# Patient Record
Sex: Female | Born: 1965 | Race: White | Hispanic: No | Marital: Married | Smoking: Former smoker
Health system: Southern US, Community
[De-identification: ages and names within clinical notes are randomized; demographics above are authoritative.]

## PROBLEM LIST (undated history)

## (undated) DIAGNOSIS — N3010 Interstitial cystitis (chronic) without hematuria: Secondary | ICD-10-CM

## (undated) DIAGNOSIS — S129XXA Fracture of neck, unspecified, initial encounter: Secondary | ICD-10-CM

## (undated) DIAGNOSIS — K2270 Barrett's esophagus without dysplasia: Secondary | ICD-10-CM

## (undated) DIAGNOSIS — Z973 Presence of spectacles and contact lenses: Secondary | ICD-10-CM

## (undated) DIAGNOSIS — K219 Gastro-esophageal reflux disease without esophagitis: Secondary | ICD-10-CM

## (undated) DIAGNOSIS — M549 Dorsalgia, unspecified: Secondary | ICD-10-CM

## (undated) DIAGNOSIS — R3982 Chronic bladder pain: Secondary | ICD-10-CM

## (undated) DIAGNOSIS — F329 Major depressive disorder, single episode, unspecified: Secondary | ICD-10-CM

## (undated) DIAGNOSIS — Z8619 Personal history of other infectious and parasitic diseases: Secondary | ICD-10-CM

## (undated) DIAGNOSIS — F32A Depression, unspecified: Secondary | ICD-10-CM

## (undated) DIAGNOSIS — Z87442 Personal history of urinary calculi: Secondary | ICD-10-CM

## (undated) DIAGNOSIS — F419 Anxiety disorder, unspecified: Secondary | ICD-10-CM

## (undated) DIAGNOSIS — G4700 Insomnia, unspecified: Secondary | ICD-10-CM

## (undated) DIAGNOSIS — R3989 Other symptoms and signs involving the genitourinary system: Secondary | ICD-10-CM

## (undated) DIAGNOSIS — M961 Postlaminectomy syndrome, not elsewhere classified: Secondary | ICD-10-CM

## (undated) DIAGNOSIS — M1990 Unspecified osteoarthritis, unspecified site: Secondary | ICD-10-CM

## (undated) DIAGNOSIS — G8929 Other chronic pain: Secondary | ICD-10-CM

## (undated) DIAGNOSIS — K2970 Gastritis, unspecified, without bleeding: Secondary | ICD-10-CM

## (undated) HISTORY — PX: ESOPHAGOGASTRODUODENOSCOPY: SHX1529

## (undated) HISTORY — PX: DILATION AND CURETTAGE OF UTERUS: SHX78

---

## 1998-06-14 ENCOUNTER — Other Ambulatory Visit: Admission: RE | Admit: 1998-06-14 | Discharge: 1998-06-14 | Payer: Self-pay | Admitting: Obstetrics and Gynecology

## 1998-08-12 ENCOUNTER — Ambulatory Visit (HOSPITAL_COMMUNITY): Admission: RE | Admit: 1998-08-12 | Discharge: 1998-08-12 | Payer: Self-pay | Admitting: *Deleted

## 1998-12-09 ENCOUNTER — Encounter (INDEPENDENT_AMBULATORY_CARE_PROVIDER_SITE_OTHER): Payer: Self-pay | Admitting: Specialist

## 1998-12-09 ENCOUNTER — Ambulatory Visit (HOSPITAL_COMMUNITY): Admission: RE | Admit: 1998-12-09 | Discharge: 1998-12-09 | Payer: Self-pay | Admitting: Obstetrics and Gynecology

## 1999-01-20 ENCOUNTER — Inpatient Hospital Stay (HOSPITAL_COMMUNITY): Admission: AD | Admit: 1999-01-20 | Discharge: 1999-01-22 | Payer: Self-pay | Admitting: Obstetrics and Gynecology

## 1999-01-20 ENCOUNTER — Encounter (INDEPENDENT_AMBULATORY_CARE_PROVIDER_SITE_OTHER): Payer: Self-pay

## 1999-02-15 ENCOUNTER — Encounter: Admission: RE | Admit: 1999-02-15 | Discharge: 1999-05-16 | Payer: Self-pay | Admitting: Obstetrics and Gynecology

## 1999-07-07 ENCOUNTER — Other Ambulatory Visit: Admission: RE | Admit: 1999-07-07 | Discharge: 1999-07-07 | Payer: Self-pay | Admitting: Obstetrics and Gynecology

## 1999-11-08 ENCOUNTER — Encounter: Payer: Self-pay | Admitting: Internal Medicine

## 1999-11-08 ENCOUNTER — Emergency Department (HOSPITAL_COMMUNITY): Admission: RE | Admit: 1999-11-08 | Discharge: 1999-11-08 | Payer: Self-pay | Admitting: Internal Medicine

## 2000-07-22 ENCOUNTER — Other Ambulatory Visit: Admission: RE | Admit: 2000-07-22 | Discharge: 2000-07-22 | Payer: Self-pay | Admitting: Obstetrics and Gynecology

## 2001-05-12 ENCOUNTER — Encounter: Payer: Self-pay | Admitting: Obstetrics and Gynecology

## 2001-05-12 ENCOUNTER — Ambulatory Visit (HOSPITAL_COMMUNITY): Admission: RE | Admit: 2001-05-12 | Discharge: 2001-05-12 | Payer: Self-pay | Admitting: Obstetrics and Gynecology

## 2001-08-08 ENCOUNTER — Encounter: Payer: Self-pay | Admitting: Obstetrics and Gynecology

## 2001-08-08 ENCOUNTER — Ambulatory Visit (HOSPITAL_COMMUNITY): Admission: RE | Admit: 2001-08-08 | Discharge: 2001-08-08 | Payer: Self-pay | Admitting: Obstetrics and Gynecology

## 2001-08-22 ENCOUNTER — Encounter: Payer: Self-pay | Admitting: *Deleted

## 2001-08-22 ENCOUNTER — Ambulatory Visit (HOSPITAL_COMMUNITY): Admission: RE | Admit: 2001-08-22 | Discharge: 2001-08-22 | Payer: Self-pay | Admitting: *Deleted

## 2001-09-05 ENCOUNTER — Encounter: Payer: Self-pay | Admitting: *Deleted

## 2001-09-05 ENCOUNTER — Ambulatory Visit (HOSPITAL_COMMUNITY): Admission: RE | Admit: 2001-09-05 | Discharge: 2001-09-05 | Payer: Self-pay | Admitting: *Deleted

## 2001-09-19 ENCOUNTER — Encounter (HOSPITAL_COMMUNITY): Admission: RE | Admit: 2001-09-19 | Discharge: 2001-10-03 | Payer: Self-pay | Admitting: Internal Medicine

## 2001-10-06 ENCOUNTER — Inpatient Hospital Stay (HOSPITAL_COMMUNITY): Admission: AD | Admit: 2001-10-06 | Discharge: 2001-10-08 | Payer: Self-pay | Admitting: Obstetrics and Gynecology

## 2001-11-17 ENCOUNTER — Other Ambulatory Visit: Admission: RE | Admit: 2001-11-17 | Discharge: 2001-11-17 | Payer: Self-pay | Admitting: Obstetrics and Gynecology

## 2003-02-24 ENCOUNTER — Other Ambulatory Visit: Admission: RE | Admit: 2003-02-24 | Discharge: 2003-02-24 | Payer: Self-pay | Admitting: Obstetrics and Gynecology

## 2004-03-07 ENCOUNTER — Other Ambulatory Visit: Admission: RE | Admit: 2004-03-07 | Discharge: 2004-03-07 | Payer: Self-pay | Admitting: Obstetrics and Gynecology

## 2004-12-18 ENCOUNTER — Ambulatory Visit: Payer: Self-pay | Admitting: Internal Medicine

## 2005-03-16 ENCOUNTER — Other Ambulatory Visit: Admission: RE | Admit: 2005-03-16 | Discharge: 2005-03-16 | Payer: Self-pay | Admitting: Obstetrics and Gynecology

## 2005-07-26 ENCOUNTER — Ambulatory Visit: Payer: Self-pay | Admitting: Internal Medicine

## 2006-01-16 ENCOUNTER — Ambulatory Visit: Payer: Self-pay | Admitting: Internal Medicine

## 2006-01-16 LAB — CONVERTED CEMR LAB
Albumin: 4.2 g/dL (ref 3.5–5.2)
Alkaline Phosphatase: 55 units/L (ref 39–117)
BUN: 7 mg/dL (ref 6–23)
Eosinophil percent: 0.6 % (ref 0.0–5.0)
Free T4: 0.6 ng/dL — ABNORMAL LOW (ref 0.9–1.8)
HCT: 39.3 % (ref 36.0–46.0)
Hemoglobin: 13.6 g/dL (ref 12.0–15.0)
Lymphocytes Relative: 18.3 % (ref 12.0–46.0)
MCV: 94 fL (ref 78.0–100.0)
Monocytes Absolute: 0.2 10*3/uL (ref 0.2–0.7)
Neutrophils Relative %: 78 % — ABNORMAL HIGH (ref 43.0–77.0)
Platelets: 216 10*3/uL (ref 150–400)
Potassium: 3.4 meq/L — ABNORMAL LOW (ref 3.5–5.1)
Sodium: 141 meq/L (ref 135–145)
Total Protein: 6.7 g/dL (ref 6.0–8.3)
WBC: 6.2 10*3/uL (ref 4.5–10.5)

## 2006-01-18 ENCOUNTER — Encounter: Admission: RE | Admit: 2006-01-18 | Discharge: 2006-01-18 | Payer: Self-pay | Admitting: Internal Medicine

## 2006-02-06 ENCOUNTER — Ambulatory Visit: Payer: Self-pay | Admitting: Internal Medicine

## 2006-07-05 ENCOUNTER — Ambulatory Visit: Payer: Self-pay | Admitting: Internal Medicine

## 2006-10-18 ENCOUNTER — Encounter: Payer: Self-pay | Admitting: Internal Medicine

## 2007-03-12 ENCOUNTER — Ambulatory Visit: Payer: Self-pay | Admitting: Internal Medicine

## 2007-04-11 ENCOUNTER — Ambulatory Visit: Payer: Self-pay | Admitting: Internal Medicine

## 2007-06-09 ENCOUNTER — Ambulatory Visit: Payer: Self-pay | Admitting: Internal Medicine

## 2008-02-04 ENCOUNTER — Ambulatory Visit: Payer: Self-pay | Admitting: Internal Medicine

## 2008-02-05 ENCOUNTER — Encounter (INDEPENDENT_AMBULATORY_CARE_PROVIDER_SITE_OTHER): Payer: Self-pay | Admitting: *Deleted

## 2008-02-05 LAB — CONVERTED CEMR LAB
Basophils Absolute: 0 10*3/uL (ref 0.0–0.1)
Eosinophils Absolute: 0.1 10*3/uL (ref 0.0–0.7)
HCT: 39 % (ref 36.0–46.0)
Hemoglobin: 13.8 g/dL (ref 12.0–15.0)
Lymphocytes Relative: 17.7 % (ref 12.0–46.0)
MCV: 94.7 fL (ref 78.0–100.0)
Platelets: 239 10*3/uL (ref 150–400)
RDW: 12.4 % (ref 11.5–14.6)
WBC: 7.5 10*3/uL (ref 4.5–10.5)

## 2008-04-02 HISTORY — PX: BREAST ENHANCEMENT SURGERY: SHX7

## 2008-05-28 ENCOUNTER — Ambulatory Visit: Payer: Self-pay | Admitting: Internal Medicine

## 2008-08-27 ENCOUNTER — Ambulatory Visit: Payer: Self-pay | Admitting: Internal Medicine

## 2008-08-27 DIAGNOSIS — R51 Headache: Secondary | ICD-10-CM

## 2008-08-27 DIAGNOSIS — S139XXA Sprain of joints and ligaments of unspecified parts of neck, initial encounter: Secondary | ICD-10-CM | POA: Insufficient documentation

## 2008-08-27 DIAGNOSIS — R519 Headache, unspecified: Secondary | ICD-10-CM | POA: Insufficient documentation

## 2009-06-01 ENCOUNTER — Ambulatory Visit: Payer: Self-pay | Admitting: Internal Medicine

## 2009-06-01 DIAGNOSIS — F329 Major depressive disorder, single episode, unspecified: Secondary | ICD-10-CM

## 2009-10-07 ENCOUNTER — Ambulatory Visit: Payer: Self-pay | Admitting: Internal Medicine

## 2009-10-07 DIAGNOSIS — R634 Abnormal weight loss: Secondary | ICD-10-CM | POA: Insufficient documentation

## 2009-10-10 LAB — CONVERTED CEMR LAB
ALT: 18 units/L (ref 0–35)
Alkaline Phosphatase: 44 units/L (ref 39–117)
Basophils Relative: 0.4 % (ref 0.0–3.0)
Bilirubin, Direct: 0.2 mg/dL (ref 0.0–0.3)
Calcium: 8.9 mg/dL (ref 8.4–10.5)
Creatinine, Ser: 0.8 mg/dL (ref 0.4–1.2)
Glucose, Bld: 91 mg/dL (ref 70–99)
HCT: 39.3 % (ref 36.0–46.0)
Hemoglobin: 13.8 g/dL (ref 12.0–15.0)
Lymphocytes Relative: 19.7 % (ref 12.0–46.0)
Lymphs Abs: 1.1 10*3/uL (ref 0.7–4.0)
MCV: 96.2 fL (ref 78.0–100.0)
Neutrophils Relative %: 72.6 % (ref 43.0–77.0)
Platelets: 219 10*3/uL (ref 150.0–400.0)
RDW: 12.9 % (ref 11.5–14.6)
TSH: 0.68 microintl units/mL (ref 0.35–5.50)
Total Protein: 7.1 g/dL (ref 6.0–8.3)
WBC: 5.6 10*3/uL (ref 4.5–10.5)

## 2009-10-13 ENCOUNTER — Encounter: Payer: Self-pay | Admitting: Internal Medicine

## 2009-10-18 ENCOUNTER — Telehealth (INDEPENDENT_AMBULATORY_CARE_PROVIDER_SITE_OTHER): Payer: Self-pay | Admitting: *Deleted

## 2009-10-19 ENCOUNTER — Telehealth (INDEPENDENT_AMBULATORY_CARE_PROVIDER_SITE_OTHER): Payer: Self-pay | Admitting: *Deleted

## 2009-10-20 ENCOUNTER — Telehealth: Payer: Self-pay | Admitting: Gastroenterology

## 2009-10-21 ENCOUNTER — Ambulatory Visit: Payer: Self-pay | Admitting: Gastroenterology

## 2009-12-14 ENCOUNTER — Telehealth (INDEPENDENT_AMBULATORY_CARE_PROVIDER_SITE_OTHER): Payer: Self-pay | Admitting: *Deleted

## 2010-04-02 DIAGNOSIS — Z87442 Personal history of urinary calculi: Secondary | ICD-10-CM

## 2010-04-02 HISTORY — DX: Personal history of urinary calculi: Z87.442

## 2010-05-04 NOTE — Progress Notes (Signed)
Summary: Request for Culture Results  Phone Note Call from Patient Call back at [phone]   Summary of Call: Message left on triage VM: Patient would like stool culture results  I contacted patient and informed her as soon as report is addressed I will call her, patient ok'd Dr.Hopper please advise on reports to be discussed with patient./Chrae [facility] CMA  October 18, 2009 9:58 AM   Follow-up for Phone Call        all cultures & studies for ova &parasites are negative(please send reports).Continue Align  probiotic once daily until stools normal. GI consultation recommended if symptoms persist or progress Follow-up by: Marga Melnick MD,  October 19, 2009 8:04 AM  Additional Follow-up for Phone Call Additional follow up Details #1::        Left message on VM with results (copy of reports to be mailed). I informed patient to call if symptoms continue and we will refer to GI Additional Follow-up by: Shonna Chock CMA,  October 19, 2009 11:50 AM

## 2010-05-04 NOTE — Assessment & Plan Note (Signed)
Summary: dirrehea x's 3 weeks/cbs   Vital Signs:  Patient profile:   45 year old female Weight:      149 pounds Temp:     98.2 degrees F oral Pulse rate:   64 / minute Resp:     14 per minute BP sitting:   124 / 80  (left arm) Cuff size:   large  Vitals Entered By: Shonna Chock (October 07, 2009 12:19 PM) CC: Diarrhea since June 05/2009, Stomach pain, HA, and fatigue Comments REVIEWED MED LIST, PATIENT AGREED DOSE AND INSTRUCTION CORRECT    CC:  Diarrhea since June 05/2009, Stomach pain, HA, and and fatigue.  History of Present Illness:  Diarrhea      This is a 45 year old woman who presents with Diarrhea X 3 weeks.  The patient reports 6  or > stools per day, watery/unformed stools, voluminous stools,occasional mucus in stool, malodorous stools, nocturnal diarrhea, fasting diarrhea, bloating, gassiness, and abrupt onset of symptoms, but denies blood in stool, greasy stools, fecal urgency, fecal soiling, and alternating diarrhea/constipation.  Associated symptoms include lower , bilateral  abdominal pain, nausea, lightheadedness, and weight loss of 10- 12#.  The patient denies fever, vomiting, increased thirst, joint pains, mouth ulcers, and eye redness.  The symptoms are no  better with fasting  or Immodium. MGGF had colon cancer. PMH of diarrhea 15 years ago from excess magnesium from vitamin supplements inadvertantly.    Allergies: 1)  ! * Avelox 2)  ! Erythromycin  Review of Systems General:  Complains of chills; denies sweats. Eyes:  Denies discharge, eye pain, and red eye. ENT:  Denies difficulty swallowing and hoarseness. GU:  Denies discharge and hematuria.  Physical Exam  General:  Appears fatigued but in no acute distress; alert,appropriate and cooperative throughout examination Eyes:  No corneal or conjunctival inflammation noted. Perrla.No icterus. No lid lag Mouth:  Oral mucosa and oropharynx without lesions or exudates.  Teeth in good repair; tongue moist. Lungs:   Normal respiratory effort, chest expands symmetrically. Lungs are clear to auscultation, no crackles or wheezes. Heart:  regular rhythm, no murmur, no gallop, no rub, no JVD, no HJR, and bradycardia.   Abdomen:  Bowel sounds positive,abdomen soft  but slightly tender  over lower quadrants without masses, organomegaly or hernias noted. Extremities:  No clubbing, cyanosis, edema, or deformity noted .  Neurologic:  alert & oriented X3 and DTRs symmetrical and normal.  No tremor Skin:  Intact without suspicious lesions or rashes. No jaundice or tenting Cervical Nodes:  No lymphadenopathy noted Axillary Nodes:  No palpable lymphadenopathy Psych:  memory intact for recent and remote and normally interactive.     Impression & Recommendations:  Problem # 1:  DIARRHEA (ICD-787.91)  Orders: Venipuncture (16109) TLB-BMP (Basic Metabolic Panel-BMET) (80048-METABOL) TLB-CBC Platelet - w/Differential (85025-CBCD) TLB-Hepatic/Liver Function Pnl (80076-HEPATIC) TLB-TSH (Thyroid Stimulating Hormone) (84443-TSH) T-Culture, Stool (87045/87046-70140) T-Culture, C-Diff Toxin A/B (60454-09811) T-Stool Giardia / Crypto- EIA (91478) T-Stool for O&P (29562-13086)  Her updated medication list for this problem includes:    Lonox 2.5-0.025 Mg Tabs (Diphenoxylate-atropine) .Marland Kitchen... 1 as needed liquid stool  Problem # 2:  WEIGHT LOSS (ICD-783.21)  Orders: Venipuncture (57846) TLB-BMP (Basic Metabolic Panel-BMET) (80048-METABOL) TLB-CBC Platelet - w/Differential (85025-CBCD) TLB-Hepatic/Liver Function Pnl (80076-HEPATIC) TLB-TSH (Thyroid Stimulating Hormone) (84443-TSH) T-Culture, Stool (87045/87046-70140) T-Culture, C-Diff Toxin A/B ([id number]) T-Stool Giardia / Crypto- EIA (24401) T-Stool for O&P (02725-36644)  Complete Medication List: 1)  Lo Ovral  .... Take as directed 2)  Ambien 5 Mg  Tabs (Zolpidem tartrate) .... Take one nightly 3)  Xanax  .... Take 1/2 tablet as needed 4)  Cymbalta 60 Mg Cpep  (Duloxetine hcl) .Marland Kitchen.. 1 once daily 5)  Tylenol Extra Strength 500 Mg Tabs (Acetaminophen) .Marland Kitchen.. 1 by mouth once daily 6)  Lonox 2.5-0.025 Mg Tabs (Diphenoxylate-atropine) .Marland Kitchen.. 1 as needed liquid stool 7)  Metronidazole 500 Mg Tabs (Metronidazole) .Marland Kitchen.. 1 three times a day ; avoid alcohol 8)  Ciprofloxacin Hcl 500 Mg Tabs (Ciprofloxacin hcl) .Marland Kitchen.. 1 two times a day  Patient Instructions: 1)  Clear liquids & non dairy foods & drinks until stable. Prescriptions: CIPROFLOXACIN HCL 500 MG TABS (CIPROFLOXACIN HCL) 1 two times a day  #14 x 0   Entered and Authorized by:   Marga Melnick MD   Signed by:   Marga Melnick MD on 10/07/2009   Method used:   Print then Give to Patient   RxID:   [id number] METRONIDAZOLE 500 MG TABS (METRONIDAZOLE) 1 three times a day ; avoid alcohol  #21 x 0   Entered and Authorized by:   Marga Melnick MD   Signed by:   Marga Melnick MD on 10/07/2009   Method used:   Print then Give to Patient   RxID:   [id number] LONOX 2.5-0.025 MG TABS (DIPHENOXYLATE-ATROPINE) 1 as needed liquid stool  #12 x 0   Entered and Authorized by:   Marga Melnick MD   Signed by:   Marga Melnick MD on 10/07/2009   Method used:   Print then Give to Patient   RxID:   [id number]

## 2010-05-04 NOTE — Progress Notes (Signed)
Summary: nausea  Phone Note From Other Clinic   Caller: Marisue Ivan @ [facility] [employer] office [phone] Call For: dr.patterson Reason for Call: Schedule Patient Appt Summary of Call: Abd Pain, nause & diarrhea x6 days. Marisue Ivan scheduled her an appt for this miorning with Dr Jarold Motto but never got a hold of her to tell her about it. Patient doesnt feel she can wait until Sept for an appt. Initial call taken by: Leanor Kail [facility],  October 20, 2009 4:37 PM  Follow-up for Phone Call        Called Dr. Frederik Pear office,  Marisue Ivan not available.  Lupita Leash Surface RN  October 20, 2009 5:00 PM  Additional Follow-up for Phone Call Additional follow up Details #1::        I'll contact GI personally ; she was never notified by our office of appt. She is an incredibly nice & intelligent individual. Additional Follow-up by: Marga Melnick MD,  October 21, 2009 5:35 AM    Additional Follow-up for Phone Call Additional follow up Details #2::    Talked with pt.  Appt sch for pt to see Dr. Jarold Motto today at 10:00. Follow-up by: Ashok Cordia RN,  October 21, 2009 8:11 AM  +  ++

## 2010-05-04 NOTE — Progress Notes (Signed)
Summary: Patient still with symptoms-lmom  Phone Note Call from Patient Call back at Home Phone [phone]   Caller: Patient Summary of Call: Patient still with stomach pain, nausea, diarrhea at least 6 x daily, patient would like referral at this time. Patient was given ABX but told to hold off on filling until culture results in. Since cultures neg patient said she will not fill.  Dr.Hopper I place referral for GI is there anything futher you would like for me to advise to patient? Shonna Chock CMA  October 19, 2009 1:05 PM   Follow-up for Phone Call        I'll refer her , but initiate the antibiotics pending that appt as the cultures can not eliminate possibility of bacterial process totally Follow-up by: Marga Melnick MD,  October 19, 2009 2:24 PM  Additional Follow-up for Phone Call Additional follow up Details #1::        left message on machine ................Marland KitchenDoristine Devoid CMA  October 19, 2009 2:27 PM     Additional Follow-up for Phone Call Additional follow up Details #2::    no show for gi appt...drp Follow-up by: Mardella Layman MD Clementeen Graham,  October 20, 2009 9:38 AM   Appended Document: Patient still with symptoms-lmom Onalee Hua, she is a wonderful person & would never "No Show". My office dropped the ball; I'm sorry.  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 Please have your staff make contact to prevent  another miscommunication.Thanks , [employer]

## 2010-05-04 NOTE — Assessment & Plan Note (Signed)
Summary: Abd pain, nausea/dfs   History of Present Illness Visit Type: Initial Visit Primary GI MD: Sheryn Bison MD FACP FAGA Primary Provider: Marga Melnick, MD Chief Complaint: Abdominal pain, constantly and nausea History of Present Illness:   45 year old Caucasian female Anne [name] of Dr. Marga Melnick. She has had 6-8 weeks of nausea, occasional crampy abdominal pain and associated watery nonbloody diarrhea with 14 pound weight loss over the last 2 months. She's had multiple stool exams which had been unremarkable and were reviewed today. She denies sick family members at home, foreign travel, antibiotic use, infectious disease exposure, but she does have cats at home. She has not had chronic recurrent GI issues and generally has a regular bowel movement every other day. She's had no systemic complaints such as fever, chills, skin rashes, joint pains, or oral stomatitis. She denies indigestion, heartburn, or any hepatobiliary complaints.  She has a history of chronic depression but denies psychosis. She is a previous smoker but is not smoking at this time and denies ethanol abuse. She also denies use of frequent NSAIDs. Screening labs have been unremarkable.   GI Review of Systems    Reports abdominal pain, bloating, loss of appetite, nausea, and  weight loss.     Location of  Abdominal pain: lower abdomen. Weight loss of 14 pounds over 2 months.   Denies acid reflux, belching, chest pain, dysphagia with liquids, dysphagia with solids, heartburn, vomiting, vomiting blood, and  weight gain.      Reports change in bowel habits, diarrhea, and  fecal incontinence.     Denies anal fissure, black tarry stools, constipation, diverticulosis, heme positive stool, hemorrhoids, irritable bowel syndrome, jaundice, light color stool, liver problems, rectal bleeding, and  rectal pain. Preventive Screening-Counseling & Management  Alcohol-Tobacco     Smoking Status: quit      Drug Use:  no.       Current Medications (verified): 1)  Lo Ovral .... Take As Directed 2)  Ambien 5 Mg  Tabs (Zolpidem Tartrate) .... Take One Nightly 3)  Xanax .... Take 1/2 Tablet As Needed 4)  Cymbalta 60 Mg Cpep (Duloxetine Hcl) .Marland Kitchen.. 1 Once Daily 5)  Tylenol Extra Strength 500 Mg Tabs (Acetaminophen) .Marland Kitchen.. 1 By Mouth Once Daily As Needed 6)  Lonox 2.5-0.025 Mg Tabs (Diphenoxylate-Atropine) .Marland Kitchen.. 1 As Needed Liquid Stool  Allergies (verified): 1)  ! * Avelox 2)  ! Erythromycin  Past History:  Past medical, surgical, family and social histories (including risk factors) reviewed for relevance to current acute and chronic problems.  Past Medical History: Reviewed history from 08/27/2008 and no changes required. abnormal pap thyroid asymmetry nerve irritation and spasm ,  cervical 2006 tinea corporis atypical bronchitis shingles onycholysis  Past Surgical History: G 3 P 40M 1; Surgery for Post Partum bleeding Breasts implants  Family History: Reviewed history from 06/01/2009 and no changes required. Mother ovarian cancer; MGM lung tumor; PGF ? alcoholism Family History of Colon Cancer:Great,great GF  Social History: Reviewed history from Anne/07/2007 and no changes required. 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  Alcohol Use - yes 1 Daily Caffeine Use 1 Illicit Drug Use - no Drug Use:  no  Review of Systems       The Anne [name] complains of allergy/sinus, anxiety-new, cough, depression-new, fatigue, headaches-new, night sweats, sleeping problems, sore throat, and thirst - excessive.  The Anne [name] denies anemia, arthritis/joint pain, back pain, blood in urine, breast changes/lumps, change in vision, confusion, coughing up blood, fainting, fever, hearing problems, heart  murmur, heart rhythm changes, itching, menstrual pain, muscle pains/cramps, nosebleeds, pregnancy symptoms, shortness of breath, skin rash, swelling of feet/legs, swollen lymph glands, thirst - excessive , urination -  excessive , urination changes/pain, urine leakage, vision changes, and voice change.    Vital Signs:  Anne [name] profile:   45 year old female Height:      68 inches Weight:      150.25 pounds BMI:     22.93 Pulse rate:   72 / minute Pulse rhythm:   regular BP sitting:   104 / 78  (left arm) Cuff size:   regular  Vitals Entered By: June McMurray CMA Duncan Dull) (October 21, 2009 10:05 AM)  Physical Exam  General:  Well developed, well nourished, no acute distress.healthy appearing.   Head:  Normocephalic and atraumatic. Eyes:  PERRLA, no icterus.exam deferred to Anne [name]'s ophthalmologist.   Neck:  Supple; no masses or thyromegaly. Lungs:  Clear throughout to auscultation. Heart:  Regular rate and rhythm; no murmurs, rubs,  or bruits. Abdomen:  Soft, nontender and nondistended. No masses, hepatosplenomegaly or hernias noted. Normal bowel sounds. Rectal:  Normal exam.soft stool which is guaiac negative. Msk:  Symmetrical with no gross deformities. Normal posture. Pulses:  Normal pulses noted. Extremities:  No clubbing, cyanosis, edema or deformities noted. Neurologic:  Alert and  oriented x4;  grossly normal neurologically. Cervical Nodes:  No significant cervical adenopathy. Psych:  Alert and cooperative. Normal mood and affect.   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 It is not unusual to fail to identify Giardia on one stool O&P exam. We have decided to take metronidazole 250 mg t.i.d. for 10 days, and she is to give [place] a progress report at that time. If she still symptomatic we will proceed with further evaluation including endoscopy and colonoscopy and other malabsorption parameters. She is on low fiber diet, and denies use of sorbitol fructose. Physical exam today shows no evidence of chronic disease. She is on antidepressant medications which she is taken for several years.  Family history is noncontributory.  Problem # 2:  WEIGHT LOSS (ICD-783.21) Assessment: Comment Only  Problem # 3:  DEPRESSIVE DISORDER (ICD-311) Assessment: Improved Continue all other medications per Dr. Alwyn Ren  Anne [name] Instructions: 1)  Please continue current medications.  2)  Please report back in 10 days and let us know how you are doing. 3)  Call sooner if you get worse. 4)  The medication list was reviewed and reconciled.  All changed / newly prescribed medications were explained.  A complete medication list was provided to the Anne [name] / caregiver. 5)  Copy sent to : Dr. Marga Melnick 6)  Please continue current medications.  7)  Advised to stick with a low residue diet  avoiding food that can irritate bowel (see handout).

## 2010-05-04 NOTE — Progress Notes (Signed)
Summary: Refill Request  Phone Note Refill Request Call back at [phone] Message from:  Pharmacy on December 14, 2009 12:04 PM  Refills Requested: Medication #1:  CYMBALTA 60 MG CPEP 1 once daily   Dosage confirmed as above?Dosage Confirmed   Supply Requested: 1 month   Last Refilled: 11/17/2009 [facility]  Next Appointment Scheduled: none Initial call taken by: Lavell Islam,  December 14, 2009 12:04 PM    Prescriptions: CYMBALTA 60 MG CPEP (DULOXETINE HCL) 1 once daily  #30 x 5   Entered by:   Shonna Chock CMA   Authorized by:   Marga Melnick MD   Signed by:   Shonna Chock CMA on 12/14/2009   Method used:   Electronically to        [facility]. [phone]* (retail)       [address]       [place], [place]  [address]       Ph: [phone]       Fax: [phone]   RxID:   [id number]

## 2010-05-04 NOTE — Assessment & Plan Note (Signed)
Summary: anixety/kdc   Vital Signs:  Patient profile:   45 year old female Weight:      158.4 pounds Pulse rate:   76 / minute Resp:     16 per minute BP sitting:   110 / 68  (left arm) Cuff size:   large  Vitals Entered By: Shonna Chock (June 01, 2009 4:12 PM) CC: Anxiety Comments REVIEWED MED LIST, PATIENT AGREED DOSE AND INSTRUCTION CORRECT    CC:  Anxiety.  History of Present Illness: Her mother has ovarian cancer with  diffuse metastases;  the illness appears terminal. Her son has learning disability & is having home behavioral issues. He has appt with a Haematologist.She feels overwhelmed.She is on Citalopram  20 mg two times a day .Pathophysiology of NT deficiency discussed  Allergies: 1)  ! * Avelox 2)  ! Erythromycin  Family History: Mother ovarian cancer; MGM lung tumor; PGF ? alcoholism  Review of Systems General:  Complains of sleep disorder; denies loss of appetite; Awakens during night ruminating . Psych:  Complains of anxiety, depression, and easily tearful; denies easily angered, irritability, and panic attacks; "I feel paralyzed @ times".  Physical Exam  General:  ; alert,appropriate and cooperative throughout examination; tearful Psych:  not anxious appearing, flat affect, and subdued.     Impression & Recommendations:  Problem # 1:  DEPRESSIVE DISORDER (ICD-311)  The following medications were removed from the medication list:    Citalopram Hydrobromide 20 Mg Tabs (Citalopram hydrobromide) .Marland Kitchen... 2 qd Her updated medication list for this problem includes:    Cymbalta 60 Mg Cpep (Duloxetine hcl) .Marland Kitchen... 1 once daily  Complete Medication List: 1)  Lo Ovral  .... Take as directed 2)  Ambien 5 Mg Tabs (Zolpidem tartrate) .... Take one nightly 3)  Xanax  .... Take 1/2 tablet as needed 4)  Cymbalta 60 Mg Cpep (Duloxetine hcl) .Marland Kitchen.. 1 once daily  Patient Instructions: 1)  Decrease Citalopram to 20 mg once daily X 2 days then start Cymbalta 30 mg once daily  X 5 . Then increase to 60 mg once daily  Prescriptions: CYMBALTA 60 MG CPEP (DULOXETINE HCL) 1 once daily  #30 x 5   Entered and Authorized by:   Marga Melnick MD   Signed by:   Marga Melnick MD on 06/01/2009   Method used:   Print then Give to Patient   RxID:   [id number]

## 2010-06-19 ENCOUNTER — Encounter: Payer: Self-pay | Admitting: Internal Medicine

## 2010-06-19 ENCOUNTER — Ambulatory Visit (INDEPENDENT_AMBULATORY_CARE_PROVIDER_SITE_OTHER): Payer: BC Managed Care – PPO | Admitting: Internal Medicine

## 2010-06-19 DIAGNOSIS — R51 Headache: Secondary | ICD-10-CM

## 2010-06-19 DIAGNOSIS — S139XXA Sprain of joints and ligaments of unspecified parts of neck, initial encounter: Secondary | ICD-10-CM

## 2010-06-29 NOTE — Assessment & Plan Note (Signed)
Summary: talk abt meds, having headaches, tight neck muscles///sph   Vital Signs:  Patient profile:   45 year old female Weight:      158.2 pounds BMI:     24.14 Temp:     98.4 degrees F oral Pulse rate:   84 / minute Resp:     14 per minute BP sitting:   130 / 82  (left arm) Cuff size:   large  Vitals Entered By: Shonna Chock CMA (June 19, 2010 2:49 PM) CC: 1.) Discuss meds: Cymbalta   2.) Discuss Headaches  3.) Injured tailbone in 12/2009 and still with complications from that    Primary Care Provider:  Marga Melnick, MD  CC:  1.) Discuss meds: Cymbalta   2.) Discuss Headaches  3.) Injured tailbone in 12/2009 and still with complications from that .  History of Present Illness:    Onset several months ago w/o trigger or injury ; ? posture & stress related. She  reports nausea and sweats, but denies vomiting, tearing of eyes, nasal congestion, sinus pressure, photophobia, and phonophobia.  The headache is described as intermittent and sharp.  The location of the pain is occipital.  High-risk features (red flags) include neck pain/stiffness and new type of headache.  The patient denies the following high-risk features: fever, vision loss or change, focal weakness, and pain worse with exertion.  Prior treatment has included a NSAID & Tylenol with partial relief.Deep massage helps for up to 48hrs. PMH of cervical spasm ; no PMH of migraines.   Current Medications (verified): 1)  Lo Ovral .... Take As Directed 2)  Ambien 5 Mg  Tabs (Zolpidem Tartrate) .... Take One Nightly 3)  Xanax .... Take 1/2 Tablet As Needed 4)  Cymbalta 60 Mg Cpep (Duloxetine Hcl) .Marland Kitchen.. 1 Once Daily 5)  Tylenol Extra Strength 500 Mg Tabs (Acetaminophen) .Marland Kitchen.. 1 By Mouth Once Daily As Needed 6)  Aleve 220 Mg Tabs (Naproxen Sodium) .... As Needed 7)  Motrin Ib 200 Mg Tabs (Ibuprofen) .... As Needed  Allergies: 1)  ! * Avelox 2)  ! Erythromycin  Past History:  Past Medical History: abnormal pap thyroid  asymmetry nerve irritation and spasm ,  cervical  spine 2006 tinea corporis atypical bronchitis shingles onycholysis  Review of Systems Eyes:  Denies blurring, double vision, and vision loss-both eyes. ENT:  Denies decreased hearing and ringing in ears. Neuro:  Complains of numbness and tingling; denies brief paralysis, disturbances in coordination, and poor balance; N&T in forearms & hands when working .  Physical Exam  General:  well-nourished,in no acute distress; alert,appropriate and cooperative throughout examination Eyes:  No corneal or conjunctival inflammation noted. EOMI. Perrla. Field of  Vision grossly normal. Ears:  External ear exam shows no significant lesions or deformities.  Otoscopic examination reveals clear canals, tympanic membranes are intact bilaterally without bulging, retraction, inflammation or discharge. Hearing is grossly normal bilaterally. Nose:  External nasal examination shows no deformity or inflammation. Nasal mucosa are pink and moist without lesions or exudates. Mouth:  Oral mucosa and oropharynx without lesions or exudates.  Teeth in good repair. No tongue deviation Neck:  No deformities, masses, or tenderness noted. Pain with ROM , especially with flexion & to L Heart:  Normal rate and regular rhythm. S1 and S2 normal without gallop, murmur, click, rub.S4 Pulses:  R and L carotid,radial  pulses are full and equal bilaterally Neurologic:  alert & oriented X3, cranial nerves II-XII intact, strength normal in all extremities, sensation intact  to light touch, gait normal, DTRs symmetrical and normal, finger-to-nose normal, and Romberg negative.   Skin:  Intact without suspicious lesions or rashes Cervical Nodes:  No lymphadenopathy noted Axillary Nodes:  No palpable lymphadenopathy Psych:  memory intact for recent and remote, normally interactive, and good eye contact.     Impression & Recommendations:  Problem # 1:  HEADACHE (ICD-784.0)  Her updated  medication list for this problem includes:    Tylenol Extra Strength 500 Mg Tabs (Acetaminophen) .Marland Kitchen... 1 by mouth once daily as needed    Aleve 220 Mg Tabs (Naproxen sodium) .Marland Kitchen... As needed    Motrin Ib 200 Mg Tabs (Ibuprofen) .Marland Kitchen... As needed    Meloxicam 7.5 Mg Tabs (Meloxicam) .Marland Kitchen... 1 two times a day as needed for pain  Orders: T-Cervical Spine Comp w/Flex & Ext (44010UV)  Problem # 2:  CERVICAL STRAIN, BILATERAL (ICD-847.0)  PMH of Her updated medication list for this problem includes:    Tylenol Extra Strength 500 Mg Tabs (Acetaminophen) .Marland Kitchen... 1 by mouth once daily as needed    Aleve 220 Mg Tabs (Naproxen sodium) .Marland Kitchen... As needed    Motrin Ib 200 Mg Tabs (Ibuprofen) .Marland Kitchen... As needed    Meloxicam 7.5 Mg Tabs (Meloxicam) .Marland Kitchen... 1 two times a day as needed for pain    Carisoprodol 350 Mg Tabs (Carisoprodol) .Marland Kitchen... 1 at bedtime as needed  Orders: T-Cervical Spine Comp w/Flex & Ext (25366YQ)  Complete Medication List: 1)  Lo Ovral  .... Take as directed 2)  Ambien 5 Mg Tabs (Zolpidem tartrate) .... Take one nightly 3)  Xanax  .... Take 1/2 tablet as needed 4)  Cymbalta 60 Mg Cpep (Duloxetine hcl) .Marland Kitchen.. 1 once daily 5)  Tylenol Extra Strength 500 Mg Tabs (Acetaminophen) .Marland Kitchen.. 1 by mouth once daily as needed 6)  Aleve 220 Mg Tabs (Naproxen sodium) .... As needed 7)  Motrin Ib 200 Mg Tabs (Ibuprofen) .... As needed 8)  Meloxicam 7.5 Mg Tabs (Meloxicam) .Marland Kitchen.. 1 two times a day as needed for pain 9)  Carisoprodol 350 Mg Tabs (Carisoprodol) .Marland Kitchen.. 1 at bedtime as needed  Patient Instructions: 1)  Massage Therapy is medicallly recommended. Ergonomics   as discussed. Consider Physical Therapy or Chiropractry. Prescriptions: CARISOPRODOL 350 MG TABS (CARISOPRODOL) 1 at bedtime as needed  #30 x 1   Entered and Authorized by:   Marga Melnick MD   Signed by:   Marga Melnick MD on 06/19/2010   Method used:   Electronically to        [facility]. [phone]* (retail)       [address]       [place], [place]  [address]       Ph: [phone]       Fax: [phone]   RxID:   [id number] MELOXICAM 7.5 MG TABS (MELOXICAM) 1 two times a day as needed for pain  #30 x 0   Entered and Authorized by:   Marga Melnick MD   Signed by:   Marga Melnick MD on 06/19/2010   Method used:   Electronically to        [facility]. [phone]* (retail)       [address]       [place], [place]  [address]       Ph: [phone]       Fax: [phone]   RxID:  [id number]    Orders Added: 1)  Est. Patient Level IV [14782] 2)  T-Cervical Spine Comp w/Flex & Ext [72052TC]

## 2010-08-18 NOTE — Discharge Summary (Signed)
NAME:  Anne [name], Anne [name]                       ACCOUNT NO.:  [id number]   MEDICAL RECORD NO.:  [id number]                   PATIENT TYPE:  REC   LOCATION:  [facility]                                 FACILITY:  [facility]   PHYSICIAN:  Huel Cote, NP                DATE OF BIRTH:  02/09/1966   DATE OF ADMISSION:  10/06/2001  DATE OF DISCHARGE:  10/08/2001                                 DISCHARGE SUMMARY   DISCHARGE DIAGNOSES:  1. Status post term delivery at 39+ weeks, delivered.  2. Status post normal spontaneous vaginal delivery.  3. Mild ventriculomegaly in the infant.  4. Advanced maternal age.   DISCHARGE MEDICATIONS:  1. Motrin 600 mg p.o. q.6h.  2. Percocet one to two tablets p.o. q.4h. p.r.n.   DISCHARGE FOLLOWUP:  The patient is to follow up in six weeks for her  routine postpartum examination.   HOSPITAL COURSE:  The patient is a 45 year old G3, P1-0-1-1 who is admitted  at 39 weeks for elective induction.  Prenatal care had been complicated by  advanced maternal age with a normal triple screen and a normal ultrasound.  She declined amniocentesis.  An ultrasound at 30 weeks for size less than  dates revealed a normal growth, but a dilated left cerebral ventricle which  was followed and remained stable and was going to be examined postnatally.   PRENATAL LABORATORIES:  O+.  Antibody negative.  RPR nonreactive.  Rubella  immune.  Hepatitis B surface antigen negative.  GC negative.  Chlamydia  negative.  Triple screen normal.  One hour Glucola 116.  Group B Strep  positive.   PAST OBSTETRICAL HISTORY:  In 1999 she had a miscarriage.  In 2000 she had a  forceps delivery of a [redacted] week gestation at 7 pounds 14 ounces with retained  placenta.   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   PAST MEDICAL HISTORY:  History of peptic ulcer disease and shingles.   PAST SURGICAL HISTORY:  None.   ALLERGIES:  IV CONTRAST and  ERYTHROMYCIN.   MEDICATIONS:  None.   SOCIAL HISTORY:  The patient is married and a Environmental education officer.   PHYSICAL EXAMINATION:  VITAL SIGNS:  She is afebrile with stable vital  signs.  Fetal heart rate is reactive.  ABDOMEN:  Gravid and nontender.  PELVIC:  On vaginal examination she was 3 cm, 40%, and -1 station.   She was placed on penicillin prophylaxis for her positive group B Strep  status and ruptured membranes were attempted with little fluid obtained.  The patient was then placed on IV Pitocin and very quickly throughout the  day reached complete dilation and pushed for approximately 20 minutes with a  normal spontaneous vaginal delivery of a vigorous female infant over a first  degree perineal laceration.  Apgars were 8/9.  Weight was 6 pounds 15  ounces.  There was  a nuchal cord x1 which was delivered.  Placenta delivered  spontaneously with some clots that were adherent to it.  First degree  laceration was repaired with 3-0 Vicryl for hemostasis.  Cervix and rectum  were intact.  Estimated blood loss was 350 cc.  The patient  was then  admitted for routine postpartum care.  The baby did have an ultrasound  postnatally which appeared normal and the patient remained stable throughout  her postpartum stay.  On postpartum day 2 she was afebrile with stable vital  signs and was felt stable for discharge home.  Therefore, was discharged  with medications and follow-up as previously stated.                                               Huel Cote, NP    KR/MEDQ  D:  11/06/2001  T:  11/08/2001  Job:  [id number]

## 2010-08-18 NOTE — Assessment & Plan Note (Signed)
[facility]                          [place] OFFICE NOTE   Anne [name], Anne [name]                    MRN:          [id number]  DATE:01/16/2006                            DOB:          May 01, 1965    Anne [name] was seen January 16, 2006 complaining abdominal bloating and  gas and bowel movement changes. Symptoms began approximately July 18 and  have been associated with lower abdominal pain, particularly in the right  lower quadrant. She has had nausea but no vomiting.   Significantly, her mother has ovarian cancer and has urged her to be  evaluated.   She averages 2 bowel movements a day which can be watery and are poorly  formed. The right lower quadrant discomfort is not associated with bowel  movements and is described as dull and persistent.   In June, she was treated with amoxicillin for 10 days for a sinus infection  but has not had persistent diarrhea.   Her menses are normal; she is on birth control pills.   She has had no travel except to the beach. She denies ingestion of raw  shellfish. She has city water source. She has no sick pets.   She states that her last Pap smear in December 2006 had some abnormality but  did not require follow up for one year.   She is on Nexium 40 mg daily, Ambien as needed, Lexapro, Xanax and Benadryl.   ERYTHROMYCIN CAUSES NAUSEA.   Weight was down slightly over 2 pounds to 145. She was afebrile and vital  signs were normal.  She had no icterus or jaundice.  The lower abdomen was slightly tender. She had no organomegaly or  lymphadenopathy.   Normal and negative studies included CBC and differential and CMET with the  exception of potassium of 3.4. Her free T3 was mildly reduced at 0.6, but  her TSH was normal. Pending are stool cards. Ultrasound on October 19 at  [facility] revealed a 3.7-mm echogenic rounded structure adjacent to  the anterior aspect of the endometrial  lining at the fundus-body junction.  The differential included a small polyp, echogenic fibroid or scar. She also  had bilateral ovarian follicles with no evidence of an ovarian mass.   A message was left on her cell phone with these reports. I do recommend  follow up with a gynecologist because of history of some changes in her Pap  smear and these ultrasound findings.   If that evaluation is negative, then I would recommend a GI evaluation. If  she develops frank diarrhea, then stool for ova and parasites and  clostridium difficile colitis, salmonella and shigella could be pursued.  These are not being completed at this time as she is afebrile and has a  normal white count and no increase in eosinophils. Levsin SL 0.125 mg  sublingually as needed was prescribed for pain.     Titus Dubin. Alwyn Ren, MD,FACP,FCCP    [facility]/MedQ  DD: 01/24/2006  DT: 01/25/2006  Job #: [id number]   cc:   Milus Mallick

## 2010-09-22 ENCOUNTER — Other Ambulatory Visit: Payer: Self-pay | Admitting: Internal Medicine

## 2010-10-24 ENCOUNTER — Other Ambulatory Visit: Payer: Self-pay | Admitting: Internal Medicine

## 2010-11-26 ENCOUNTER — Other Ambulatory Visit: Payer: Self-pay | Admitting: Internal Medicine

## 2010-12-30 ENCOUNTER — Other Ambulatory Visit: Payer: Self-pay | Admitting: Internal Medicine

## 2011-01-30 ENCOUNTER — Other Ambulatory Visit: Payer: Self-pay | Admitting: Internal Medicine

## 2011-01-31 NOTE — Telephone Encounter (Signed)
Done

## 2011-01-31 NOTE — Telephone Encounter (Signed)
OK X 1 OVINB 

## 2011-01-31 NOTE — Telephone Encounter (Signed)
Soma [last refill #30x0 12/30/10]

## 2011-04-02 ENCOUNTER — Other Ambulatory Visit: Payer: Self-pay | Admitting: Internal Medicine

## 2011-04-02 NOTE — Telephone Encounter (Signed)
#  30 okay. Labeled as "take as infrequently as possible, as needed only. May cause drowsiness and affect balance."

## 2011-04-02 NOTE — Telephone Encounter (Signed)
Last filled 01-30-11 #30 1, last ov 06-19-10

## 2011-04-06 ENCOUNTER — Other Ambulatory Visit: Payer: Self-pay | Admitting: Internal Medicine

## 2011-04-06 MED ORDER — CARISOPRODOL 350 MG PO TABS: ORAL_TABLET | ORAL | Status: DC

## 2011-04-06 NOTE — Telephone Encounter (Signed)
Called pharmacy, rx was sent in on 04/02/11 (never received), ok'd #30/0 refills

## 2011-04-17 ENCOUNTER — Other Ambulatory Visit: Payer: Self-pay | Admitting: Internal Medicine

## 2011-04-18 ENCOUNTER — Other Ambulatory Visit: Payer: Self-pay

## 2011-04-18 MED ORDER — CARISOPRODOL 350 MG PO TABS: ORAL_TABLET | ORAL | Status: DC

## 2011-04-18 NOTE — Telephone Encounter (Signed)
Has to print at [facility]. Printed RX shredded at OR

## 2011-04-20 ENCOUNTER — Ambulatory Visit (INDEPENDENT_AMBULATORY_CARE_PROVIDER_SITE_OTHER): Payer: BC Managed Care – PPO | Admitting: Family Medicine

## 2011-04-20 ENCOUNTER — Encounter: Payer: Self-pay | Admitting: Family Medicine

## 2011-04-20 VITALS — BP 120/70 | HR 109 | Temp 98.6°F | Ht 67.75 in | Wt 148.4 lb

## 2011-04-20 DIAGNOSIS — J3489 Other specified disorders of nose and nasal sinuses: Secondary | ICD-10-CM

## 2011-04-20 DIAGNOSIS — J329 Chronic sinusitis, unspecified: Secondary | ICD-10-CM | POA: Insufficient documentation

## 2011-04-20 MED ORDER — BENZONATATE 200 MG PO CAPS: 200.0000 mg | ORAL_CAPSULE | Freq: Three times a day (TID) | ORAL | Status: AC | PRN

## 2011-04-20 MED ORDER — GUAIFENESIN-CODEINE 100-10 MG/5ML PO SYRP: 10.0000 mL | ORAL_SOLUTION | Freq: Three times a day (TID) | ORAL | Status: AC | PRN

## 2011-04-20 MED ORDER — AMOXICILLIN 875 MG PO TABS: 875.0000 mg | ORAL_TABLET | Freq: Two times a day (BID) | ORAL | Status: AC

## 2011-04-20 NOTE — Patient Instructions (Signed)
This is a sinus infection Take the Amoxicillin as directed- take w/ food to avoid upset stomach Add Mucinex to thin your congestion Drink plenty of fluids REST! Cough meds as needed Call with any questions or concerns Hang in there!!!

## 2011-04-20 NOTE — Assessment & Plan Note (Signed)
New.  Pt's sxs and PE consistent w/ infxn.  Start abx.  Reviewed supportive care and red flags that should prompt return.  Pt expressed understanding and is in agreement w/ plan.  

## 2011-04-20 NOTE — Progress Notes (Signed)
  Subjective:    Patient ID: Anne [name], female    DOB: December 26, 1965, 46 y.o.   MRN: [id number]  HPI ? Sinusitis- sxs started Monday night after flying to [place].  Sister was sick.  + facial pain, tooth pain, HA, sore throat, cough- productive.  No fever.  Hx of similar.   Review of Systems For ROS see HPI     Objective:   Physical Exam  Vitals reviewed. Constitutional: She appears well-developed and well-nourished. No distress.  HENT:  Head: Normocephalic and atraumatic.  Right Ear: Tympanic membrane normal.  Left Ear: Tympanic membrane normal.  Nose: Mucosal edema and rhinorrhea present. Right sinus exhibits maxillary sinus tenderness and frontal sinus tenderness. Left sinus exhibits maxillary sinus tenderness and frontal sinus tenderness.  Mouth/Throat: Uvula is midline and mucous membranes are normal. Posterior oropharyngeal erythema present. No oropharyngeal exudate.  Eyes: Conjunctivae and EOM are normal. Pupils are equal, round, and reactive to light.  Neck: Normal range of motion. Neck supple.  Cardiovascular: Normal rate, regular rhythm and normal heart sounds.   Pulmonary/Chest: Effort normal and breath sounds normal. No respiratory distress. She has no wheezes.  Lymphadenopathy:    She has no cervical adenopathy.          Assessment & Plan:

## 2011-05-18 ENCOUNTER — Telehealth: Payer: Self-pay | Admitting: Internal Medicine

## 2011-05-18 ENCOUNTER — Ambulatory Visit (INDEPENDENT_AMBULATORY_CARE_PROVIDER_SITE_OTHER): Payer: BC Managed Care – PPO | Admitting: Family Medicine

## 2011-05-18 ENCOUNTER — Encounter: Payer: Self-pay | Admitting: Family Medicine

## 2011-05-18 VITALS — BP 125/80 | HR 87 | Temp 98.0°F | Ht 68.0 in | Wt 153.6 lb

## 2011-05-18 DIAGNOSIS — J329 Chronic sinusitis, unspecified: Secondary | ICD-10-CM

## 2011-05-18 MED ORDER — DULOXETINE HCL 60 MG PO CPEP: 60.0000 mg | ORAL_CAPSULE | Freq: Every day | ORAL | Status: DC

## 2011-05-18 MED ORDER — DOXYCYCLINE HYCLATE 100 MG PO TABS: 100.0000 mg | ORAL_TABLET | Freq: Two times a day (BID) | ORAL | Status: AC

## 2011-05-18 NOTE — Patient Instructions (Signed)
This is a sinus infection/allergy combo Start the Doxy twice daily (w/ food) Start Claritin or Zyrtec daily for seasonal allergy and post nasal drip Drink plenty of fluids REST! Hang in there!!!

## 2011-05-18 NOTE — Telephone Encounter (Signed)
Patient had an appointment this afternoon with Dr. Katharina Caper Triage Call Report Triage Record Num: [id number] Operator: Thayer Headings Patient Name: Anne [name] Call Date & Time: 05/18/2011 2:17:32PM Patient Phone: [phone] PCP: Marga Melnick Patient Gender: Female PCP Fax : [phone] Patient DOB: Feb 23, 1966 Practice Name: [facility] Reason for Call: Caller: Gracee/Other; PCP: Marga Melnick; CB#: [phone]; ; ; Calling today 05/18/11 regarding was seen by Dr. Beverely Low on 04/20/11 and dx with sinus infection and prescribed Amoxicillin. Pt has finished this but calling back now b/c having similar symptoms. Having pain in teeth/head. Also sinus pressure. Having green/yellow drainage. Afebrile. Never completely cleared up but seems to have gotten worse in past few days. Emergent symptoms r/o by Upper Respiratory Infection guidelines with exception of symptoms worsen after 7 days or symptoms do not improve after 14 days of home care. Appt scheduled for today 05/18/11 at 3:15 with Dr. Beverely Low. Care advice given. Protocol(s) Used: Upper Respiratory Infection (URI) Recommended Outcome per Protocol: See Provider within 24 hours Reason for Outcome: Symptoms worsen after 7 days or symptoms do not improve after 14 days of home care Care Advice: ~ Use a cool mist humidifier to moisten air. Be sure to clean according to manufacturer's instructions. ~ Consider use of a saline nasal spray per package directions to help relieve nasal congestion. ~ Warm fluids may help, or try a mixture of honey and lemon juice in warm tea. Analgesic/Antipyretic Advice - Acetaminophen: Consider acetaminophen as directed on label or by pharmacist/provider for pain or fever PRECAUTIONS: - Use if there is no history of liver disease, alcoholism, or intake of three or more alcohol drinks per day - Only if approved by provider during pregnancy or when breastfeeding - During  pregnancy, acetaminophen should not be taken more than 3 consecutive days without telling provider - Do not exceed recommended dose or frequency ~ Analgesic/Antipyretic Advice - NSAIDs: Consider aspirin, ibuprofen, naproxen or ketoprofen for pain or fever as directed on label or by pharmacist/provider. PRECAUTIONS: - If over 6 years of age, should not take longer than 1 week without consulting provider. EXCEPTIONS: - Should not be used if taking blood thinners or have bleeding problems. - Do not use if have history of sensitivity/allergy to any of these medications; or history of cardiovascular, ulcer, kidney, liver disease or diabetes unless approved by provider. - Do not exceed recommended dose or frequency. ~

## 2011-05-18 NOTE — Progress Notes (Signed)
  Subjective:    Patient ID: Anne [name], female    DOB: [birth year], 46 y.o.   MRN: [id number]  HPI ? Sinus infxn- had similar sxs on 1/18 and was tx'd w/ amox.  sxs improved but didn't completely resolve and then returned early this week.  + facial pressure, tooth pain, HA.  No ear pain.  + PND and sore throat.  Still having 'lingering' productive cough.  No fevers.   Review of Systems For ROS see HPI     Objective:   Physical Exam  Vitals reviewed. Constitutional: She appears well-developed and well-nourished. No distress.  HENT:  Head: Normocephalic and atraumatic.  Right Ear: Tympanic membrane normal.  Left Ear: Tympanic membrane normal.  Nose: Mucosal edema and rhinorrhea present. Right sinus exhibits maxillary sinus tenderness and frontal sinus tenderness. Left sinus exhibits maxillary sinus tenderness and frontal sinus tenderness.  Mouth/Throat: Uvula is midline and mucous membranes are normal. Posterior oropharyngeal erythema present. No oropharyngeal exudate.  Eyes: Conjunctivae and EOM are normal. Pupils are equal, round, and reactive to light.  Neck: Normal range of motion. Neck supple.  Cardiovascular: Normal rate, regular rhythm and normal heart sounds.   Pulmonary/Chest: Effort normal and breath sounds normal. No respiratory distress. She has no wheezes.  Lymphadenopathy:    She has no cervical adenopathy.          Assessment & Plan:

## 2011-05-20 NOTE — Assessment & Plan Note (Signed)
Pt's sxs and PE consistent w/ infxn.  Also evidence of allergic rhinitis.  Start abx.  Add OTC antihistamine.  Reviewed supportive care and red flags that should prompt return.  Pt expressed understanding and is in agreement w/ plan.

## 2011-07-04 ENCOUNTER — Other Ambulatory Visit: Payer: Self-pay | Admitting: Internal Medicine

## 2011-07-05 NOTE — Telephone Encounter (Signed)
Patient needs to schedule a CPX with Dr.Hopper  

## 2011-07-30 ENCOUNTER — Other Ambulatory Visit: Payer: Self-pay | Admitting: Internal Medicine

## 2011-07-30 NOTE — Telephone Encounter (Signed)
Patient needs to schedule a CPX with Dr.Hopper  

## 2011-08-07 ENCOUNTER — Other Ambulatory Visit: Payer: Self-pay | Admitting: Internal Medicine

## 2011-08-08 NOTE — Telephone Encounter (Signed)
I am concerned about the amount of muscle relaxants she's having to take. Definitive diagnosis and intervention are indicated rather than simply refilling the muscle relaxant. Please schedule office visit to optimally assess & address this problem. Thanks

## 2011-08-08 NOTE — Telephone Encounter (Signed)
Last OV 05/17/11 (acute-seen Tabori), Last OV with Dr.Hopper 06/19/10. No pending appointment. Last filled 07/30/11 #30.  Dr.Hopper please advise on refill request based on provided information

## 2011-08-08 NOTE — Telephone Encounter (Signed)
Left message on voicemail for patient to return call to schedule appointment

## 2011-08-08 NOTE — Telephone Encounter (Signed)
lmovm for patient to call office. °

## 2011-08-08 NOTE — Telephone Encounter (Signed)
Patient left call back number to be reached at work [phone]

## 2011-08-09 NOTE — Telephone Encounter (Signed)
Pt is scheduled °

## 2011-08-15 ENCOUNTER — Ambulatory Visit: Payer: BC Managed Care – PPO | Admitting: Internal Medicine

## 2011-08-28 ENCOUNTER — Ambulatory Visit (INDEPENDENT_AMBULATORY_CARE_PROVIDER_SITE_OTHER): Payer: BC Managed Care – PPO | Admitting: Internal Medicine

## 2011-08-28 ENCOUNTER — Encounter: Payer: Self-pay | Admitting: Internal Medicine

## 2011-08-28 VITALS — BP 130/78 | HR 92 | Wt 154.4 lb

## 2011-08-28 DIAGNOSIS — M545 Low back pain: Secondary | ICD-10-CM

## 2011-08-28 DIAGNOSIS — R51 Headache: Secondary | ICD-10-CM

## 2011-08-28 DIAGNOSIS — J309 Allergic rhinitis, unspecified: Secondary | ICD-10-CM

## 2011-08-28 MED ORDER — MELOXICAM 15 MG PO TABS: ORAL_TABLET | ORAL | Status: DC

## 2011-08-28 MED ORDER — DULOXETINE HCL 60 MG PO CPEP: 60.0000 mg | ORAL_CAPSULE | Freq: Every day | ORAL | Status: DC

## 2011-08-28 MED ORDER — CARISOPRODOL 350 MG PO TABS: ORAL_TABLET | ORAL | Status: DC

## 2011-08-28 MED ORDER — FLUTICASONE PROPIONATE 50 MCG/ACT NA SUSP: 1.0000 | Freq: Two times a day (BID) | NASAL | Status: DC | PRN

## 2011-08-28 NOTE — Patient Instructions (Addendum)
The best exercises for the low back include freestyle swimming, stretch aerobics, and yoga.   Plain Mucinex for thick secretions ;force NON dairy fluids . Use a Neti pot daily as needed for sinus congestion; going from open side to congested side . Nasal cleansing in the shower as discussed. Make sure that all residual soap is removed to prevent irritation. Fluticasone 1 spray in each nostril twice a day as needed. Use the "crossover" technique as discussed. Plain Allegra 160 daily as needed for itchy eyes & sneezing.    
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
## 2011-08-30 ENCOUNTER — Telehealth: Payer: Self-pay | Admitting: Internal Medicine

## 2011-08-30 NOTE — Telephone Encounter (Signed)
If possible I would like a copy of the x-ray report to include in her medical record for continuity of care and completeness

## 2011-08-30 NOTE — Telephone Encounter (Signed)
[facility] called stating they received a request from Dr. Alwyn Ren regarding the patient's xrays, but they do not know exactly what he needs and if he wants the xrays or the xray reports. Call back @ [phone]

## 2011-08-30 NOTE — Telephone Encounter (Signed)
Dr.Hopper please advise 

## 2011-09-05 NOTE — Telephone Encounter (Signed)
Spoke w/[facility]. She will fax over xray reports tomorrow. BC

## 2011-12-17 ENCOUNTER — Telehealth: Payer: Self-pay | Admitting: Internal Medicine

## 2011-12-17 MED ORDER — DULOXETINE HCL 60 MG PO CPEP: 60.0000 mg | ORAL_CAPSULE | Freq: Every day | ORAL | Status: DC

## 2011-12-17 NOTE — Telephone Encounter (Signed)
Left msg for pt to call.  Cymbalta #90 sent to mail order.  Will check with pt to see what local pharmacy she would like the temporary sent to while waiting for the mail order.

## 2011-12-17 NOTE — Telephone Encounter (Signed)
Pt thought she had another bottle of Cymbalta at home, but she is completely out.  She needs a refill sent to [facility], but also would like to know if she is ok to not take it for a few days while waiting on the mail order to come in or should she get a temporary rx at a local pharmacy.

## 2011-12-17 NOTE — Telephone Encounter (Signed)
Samples or 30 day local Rx . Fill # 90 for mail order with 1 refill

## 2011-12-17 NOTE — Telephone Encounter (Signed)
Patient left message on triage voicemail stating she is having prescription issues with her mail order company and she is completely out of her medication. She did not specify which one. Call back # [phone]

## 2011-12-17 NOTE — Telephone Encounter (Signed)
Pt aware and just wanted #10 sent to [facility].  Rx sent in.

## 2012-01-01 LAB — HM PAP SMEAR

## 2012-01-02 ENCOUNTER — Other Ambulatory Visit: Payer: Self-pay | Admitting: Internal Medicine

## 2012-01-10 ENCOUNTER — Ambulatory Visit (INDEPENDENT_AMBULATORY_CARE_PROVIDER_SITE_OTHER): Payer: BC Managed Care – PPO | Admitting: Family Medicine

## 2012-01-10 ENCOUNTER — Encounter: Payer: Self-pay | Admitting: Family Medicine

## 2012-01-10 VITALS — BP 104/70 | HR 88 | Temp 97.7°F | Ht 67.0 in | Wt 152.4 lb

## 2012-01-10 DIAGNOSIS — H6980 Other specified disorders of Eustachian tube, unspecified ear: Secondary | ICD-10-CM

## 2012-01-10 DIAGNOSIS — H6990 Unspecified Eustachian tube disorder, unspecified ear: Secondary | ICD-10-CM | POA: Insufficient documentation

## 2012-01-10 NOTE — Progress Notes (Signed)
  Subjective:    Patient ID: Anne [name], female    DOB: 11/15/1965, 46 y.o.   MRN: [id number]  HPI R ear pain- sxs started 'several weeks ago'.  No fevers.  No nasal congestion, no facial pain/pressure, cough, sore throat.  + dizziness.  No recent sick contacts.     Review of Systems For ROS see HPI     Objective:   Physical Exam  Vitals reviewed. Constitutional: She appears well-developed and well-nourished. No distress.  HENT:  Head: Normocephalic and atraumatic.  Right Ear: Tympanic membrane normal.  Left Ear: Tympanic membrane normal.  Nose: Mucosal edema and rhinorrhea present. Right sinus exhibits no maxillary sinus tenderness and no frontal sinus tenderness. Left sinus exhibits no maxillary sinus tenderness and no frontal sinus tenderness.  Mouth/Throat: Mucous membranes are normal. Posterior oropharyngeal erythema (w/ PND) present.  Eyes: Conjunctivae normal and EOM are normal. Pupils are equal, round, and reactive to light.  Neck: Normal range of motion. Neck supple.  Cardiovascular: Normal rate, regular rhythm and normal heart sounds.   Pulmonary/Chest: Effort normal and breath sounds normal. No respiratory distress. She has no wheezes. She has no rales.  Lymphadenopathy:    She has no cervical adenopathy.          Assessment & Plan:

## 2012-01-10 NOTE — Patient Instructions (Addendum)
This is called Eustachian tube dysfunction Start Sudafed or similar decongestant for 2-3 days to improve your pain Drink plenty of fluids Call with any questions or concerns Hang in there!!!

## 2012-01-15 NOTE — Assessment & Plan Note (Signed)
New.  Reviewed dx and tx w/ pt.  Start OTC decongestant for sxs relief.  Reviewed supportive care and red flags that should prompt return.  Pt expressed understanding and is in agreement w/ plan.

## 2012-02-29 ENCOUNTER — Other Ambulatory Visit: Payer: Self-pay | Admitting: Internal Medicine

## 2012-02-29 NOTE — Telephone Encounter (Signed)
Ok to refill 

## 2012-02-29 NOTE — Telephone Encounter (Signed)
done

## 2012-03-22 ENCOUNTER — Encounter (HOSPITAL_COMMUNITY): Payer: Self-pay | Admitting: Pharmacist

## 2012-03-31 ENCOUNTER — Encounter (HOSPITAL_COMMUNITY): Payer: Self-pay | Admitting: *Deleted

## 2012-04-02 ENCOUNTER — Other Ambulatory Visit: Payer: Self-pay | Admitting: Internal Medicine

## 2012-04-04 ENCOUNTER — Encounter (HOSPITAL_COMMUNITY): Payer: Self-pay

## 2012-04-04 ENCOUNTER — Ambulatory Visit (HOSPITAL_COMMUNITY): Payer: BC Managed Care – PPO

## 2012-04-04 ENCOUNTER — Encounter (HOSPITAL_COMMUNITY)
Admission: RE | Disposition: A | Discharge status: Home / Self Care | Payer: Self-pay | Source: Ambulatory Visit | Attending: Obstetrics and Gynecology

## 2012-04-04 ENCOUNTER — Ambulatory Visit (HOSPITAL_COMMUNITY)
Admission: RE | Admit: 2012-04-04 | Discharge: 2012-04-04 | Disposition: A | Discharge status: Home / Self Care | Payer: BC Managed Care – PPO | Source: Ambulatory Visit | Attending: Obstetrics and Gynecology | Admitting: Obstetrics and Gynecology

## 2012-04-04 DIAGNOSIS — N920 Excessive and frequent menstruation with regular cycle: Secondary | ICD-10-CM | POA: Insufficient documentation

## 2012-04-04 HISTORY — PX: DILITATION & CURRETTAGE/HYSTROSCOPY WITH NOVASURE ABLATION: SHX5568

## 2012-04-04 HISTORY — DX: Depression, unspecified: F32.A

## 2012-04-04 HISTORY — DX: Gastro-esophageal reflux disease without esophagitis: K21.9

## 2012-04-04 HISTORY — DX: Major depressive disorder, single episode, unspecified: F32.9

## 2012-04-04 HISTORY — DX: Unspecified osteoarthritis, unspecified site: M19.90

## 2012-04-04 HISTORY — DX: Insomnia, unspecified: G47.00

## 2012-04-04 HISTORY — DX: Anxiety disorder, unspecified: F41.9

## 2012-04-04 LAB — CBC
HCT: 38.8 % (ref 36.0–46.0)
Hemoglobin: 13 g/dL (ref 12.0–15.0)
Hemoglobin: 12.2 g/dL (ref 12.0–15.0)
MCH: 31.9 pg (ref 26.0–34.0)
MCH: 31.9 pg (ref 26.0–34.0)
MCHC: 33.5 g/dL (ref 30.0–36.0)
MCHC: 33.6 g/dL (ref 30.0–36.0)
MCV: 94.8 fL (ref 78.0–100.0)
RDW: 12.8 % (ref 11.5–15.5)

## 2012-04-04 SURGERY — DILATATION & CURETTAGE/HYSTEROSCOPY WITH NOVASURE ABLATION
Anesthesia: General

## 2012-04-04 MED ORDER — MIDAZOLAM HCL 2 MG/2ML IJ SOLN
INTRAMUSCULAR | Status: AC
  Administered 2012-04-04: 1 mg via INTRAVENOUS
  Filled 2012-04-04: qty 2

## 2012-04-04 MED ORDER — DEXAMETHASONE SODIUM PHOSPHATE 4 MG/ML IJ SOLN
INTRAMUSCULAR | Status: DC | PRN
  Administered 2012-04-04: 4 mg via INTRAVENOUS

## 2012-04-04 MED ORDER — HYDROCODONE-ACETAMINOPHEN 5-500 MG PO TABS
1.0000 | ORAL_TABLET | ORAL | Status: DC | PRN
Start: 2012-04-04 — End: 2012-07-25

## 2012-04-04 MED ORDER — LIDOCAINE HCL 2 % IJ SOLN
INTRAMUSCULAR | Status: AC
  Filled 2012-04-04: qty 20

## 2012-04-04 MED ORDER — LACTATED RINGERS IR SOLN
Status: DC | PRN
  Administered 2012-04-04: 3000 mL

## 2012-04-04 MED ORDER — ONDANSETRON HCL 4 MG/2ML IJ SOLN
INTRAMUSCULAR | Status: AC
  Filled 2012-04-04: qty 2

## 2012-04-04 MED ORDER — MIDAZOLAM HCL 2 MG/2ML IJ SOLN
0.5000 mg | Freq: Once | INTRAMUSCULAR | Status: AC | PRN
  Administered 2012-04-04: 1 mg via INTRAVENOUS

## 2012-04-04 MED ORDER — PROPOFOL 10 MG/ML IV EMUL
INTRAVENOUS | Status: AC
  Filled 2012-04-04: qty 20

## 2012-04-04 MED ORDER — PROPOFOL 10 MG/ML IV EMUL
INTRAVENOUS | Status: DC | PRN
  Administered 2012-04-04: 200 mg via INTRAVENOUS

## 2012-04-04 MED ORDER — MEPERIDINE HCL 25 MG/ML IJ SOLN: 6.2500 mg | INTRAMUSCULAR | Status: DC | PRN

## 2012-04-04 MED ORDER — LACTATED RINGERS IV SOLN
INTRAVENOUS | Status: DC
  Administered 2012-04-04: 07:00:00 via INTRAVENOUS

## 2012-04-04 MED ORDER — FENTANYL CITRATE 0.05 MG/ML IJ SOLN
25.0000 ug | INTRAMUSCULAR | Status: DC | PRN
  Administered 2012-04-04: 50 ug via INTRAVENOUS

## 2012-04-04 MED ORDER — FENTANYL CITRATE 0.05 MG/ML IJ SOLN
INTRAMUSCULAR | Status: AC
  Filled 2012-04-04: qty 2

## 2012-04-04 MED ORDER — MIDAZOLAM HCL 5 MG/5ML IJ SOLN
INTRAMUSCULAR | Status: DC | PRN
  Administered 2012-04-04: 2 mg via INTRAVENOUS

## 2012-04-04 MED ORDER — PROMETHAZINE HCL 25 MG/ML IJ SOLN: 6.2500 mg | INTRAMUSCULAR | Status: DC | PRN

## 2012-04-04 MED ORDER — LIDOCAINE HCL 2 % IJ SOLN
INTRAMUSCULAR | Status: DC | PRN
  Administered 2012-04-04: 16 mL

## 2012-04-04 MED ORDER — MIDAZOLAM HCL 2 MG/2ML IJ SOLN
INTRAMUSCULAR | Status: AC
  Filled 2012-04-04: qty 2

## 2012-04-04 MED ORDER — LIDOCAINE HCL (CARDIAC) 20 MG/ML IV SOLN
INTRAVENOUS | Status: AC
  Filled 2012-04-04: qty 5

## 2012-04-04 MED ORDER — KETOROLAC TROMETHAMINE 30 MG/ML IJ SOLN
INTRAMUSCULAR | Status: AC
  Filled 2012-04-04: qty 2

## 2012-04-04 MED ORDER — KETOROLAC TROMETHAMINE 30 MG/ML IJ SOLN: 15.0000 mg | Freq: Once | INTRAMUSCULAR | Status: DC | PRN

## 2012-04-04 MED ORDER — KETOROLAC TROMETHAMINE 30 MG/ML IJ SOLN
INTRAMUSCULAR | Status: AC
  Filled 2012-04-04: qty 1

## 2012-04-04 MED ORDER — LIDOCAINE HCL (CARDIAC) 20 MG/ML IV SOLN
INTRAVENOUS | Status: DC | PRN
  Administered 2012-04-04: 50 mg via INTRAVENOUS

## 2012-04-04 MED ORDER — FENTANYL CITRATE 0.05 MG/ML IJ SOLN
INTRAMUSCULAR | Status: AC
  Administered 2012-04-04: 50 ug via INTRAVENOUS
  Filled 2012-04-04: qty 2

## 2012-04-04 MED ORDER — ONDANSETRON HCL 4 MG/2ML IJ SOLN
INTRAMUSCULAR | Status: DC | PRN
  Administered 2012-04-04: 4 mg via INTRAVENOUS

## 2012-04-04 MED ORDER — KETOROLAC TROMETHAMINE 30 MG/ML IJ SOLN
INTRAMUSCULAR | Status: DC | PRN
  Administered 2012-04-04: 30 mg via INTRAVENOUS

## 2012-04-04 MED ORDER — DEXAMETHASONE SODIUM PHOSPHATE 10 MG/ML IJ SOLN
INTRAMUSCULAR | Status: AC
  Filled 2012-04-04: qty 1

## 2012-04-04 MED ORDER — FENTANYL CITRATE 0.05 MG/ML IJ SOLN
INTRAMUSCULAR | Status: DC | PRN
  Administered 2012-04-04 (×2): 50 ug via INTRAVENOUS

## 2012-04-04 SURGICAL SUPPLY — 11 items
ABLATOR ENDOMETRIAL BIPOLAR (ABLATOR) ×2 IMPLANT
CATH ROBINSON RED A/P 16FR (CATHETERS) ×2 IMPLANT
CLOTH BEACON ORANGE TIMEOUT ST (SAFETY) ×2 IMPLANT
DRESSING TELFA 8X3 (GAUZE/BANDAGES/DRESSINGS) ×2 IMPLANT
GLOVE BIO SURGEON STRL SZ8 (GLOVE) ×2 IMPLANT
GLOVE ORTHO TXT STRL SZ7.5 (GLOVE) ×2 IMPLANT
GOWN STRL REIN XL XLG (GOWN DISPOSABLE) ×4 IMPLANT
PACK HYSTEROSCOPY LF (CUSTOM PROCEDURE TRAY) ×2 IMPLANT
PAD OB MATERNITY 4.3X12.25 (PERSONAL CARE ITEMS) ×2 IMPLANT
TOWEL OR 17X24 6PK STRL BLUE (TOWEL DISPOSABLE) ×4 IMPLANT
WATER STERILE IRR 1000ML POUR (IV SOLUTION) ×2 IMPLANT

## 2012-04-04 NOTE — Telephone Encounter (Signed)
Called patient on Cell, VM box not set up, I was unable to leave message.  Called home phone, VM box is full and there is not enough space to leave message.   I will try to reach patient again later, reason for call: patient needs to sign a controlled substance contract

## 2012-04-04 NOTE — Interval H&P Note (Signed)
History and Physical Interval Note:  04/04/2012 7:10 AM  Anne [name]  has presented today for surgery, with the diagnosis of menorrhagia  The various methods of treatment have been discussed with the patient and family. After consideration of risks, benefits and other options for treatment, the patient has consented to  Procedure(s) (LRB) with comments: DILATATION & CURETTAGE/HYSTEROSCOPY WITH NOVASURE ABLATION (N/A) as a surgical intervention .  The patient's history has been reviewed, patient examined, no change in status, stable for surgery.  I have reviewed the patient's chart and labs.  Questions were answered to the patient's satisfaction.     [name] D

## 2012-04-04 NOTE — Addendum Note (Signed)
Addendum  created 04/04/12 0931 by Corita Allinson G Izaiah Tabb, CRNA   Modules edited:Anesthesia Flowsheet    
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
## 2012-04-04 NOTE — Anesthesia Postprocedure Evaluation (Signed)
  Anesthesia Post-op Note  Patient: Anne [name]  Procedure(s) Performed: Procedure(s) (LRB) with comments: DILATATION & CURETTAGE/HYSTEROSCOPY WITH NOVASURE ABLATION (N/A) Patient is awake and responsive. Pain and nausea are reasonably well controlled. Vital signs are stable and clinically acceptable. Oxygen saturation is clinically acceptable. There are no apparent anesthetic complications at this time. Patient is ready for discharge.

## 2012-04-04 NOTE — Transfer of Care (Signed)
Immediate Anesthesia Transfer of Care Note  Patient: Anne [name]  Procedure(s) Performed: Procedure(s) (LRB) with comments: DILATATION & CURETTAGE/HYSTEROSCOPY WITH NOVASURE ABLATION (N/A)  Patient Location: PACU  Anesthesia Type:General  Level of Consciousness: awake  Airway & Oxygen Therapy: Patient Spontanous Breathing  Post-op Assessment: Report given to PACU RN  Post vital signs: stable  Filed Vitals:   04/04/12 0646  BP: 138/79  Pulse: 72  Temp: 36.7 C  Resp: 18    Complications: No apparent anesthesia complications

## 2012-04-04 NOTE — Addendum Note (Signed)
Addendum  created 04/04/12 0931 by Wilder Glade, CRNA   Modules edited:Anesthesia Flowsheet

## 2012-04-04 NOTE — Op Note (Signed)
Preoperative diagnosis: Menorrhagia Postoperative diagnosis: Menorrhagia Procedure: Hysteroscopy, NovaSure endometrial ablation Surgeon: Lavina Hamman M.D. Anesthesia: Gen. With an LMA, deep paracervical block Findings: She had a normal endometrial cavity with minimal endometrial tissue and no unusual lesions. There was a small central fundal perforation site.  The NovaSure device used to a depth of 5 cm, a width of 3.7 cm and used 102 W for 43 seconds. Fluid deficit to the hysteroscope was 115 cc. Estimated blood loss: Minimal Specimens: None Complications: Uterine perforation  Procedure in detail: The patient was taken to the operating room and placed in the dorsosupine position. General anesthesia was induced and she was placed in mobile stirrups. Perineum and vagina were prepped and draped in usual sterile fashion and bladder drained with a red Robinson catheter. A Graves speculum was inserted into the vagina and the anterior lip of the cervix was grasped with a single-tooth tenaculum. The paracervical block was then performed with a total of 16 cc 1% lidocaine. Uterus then sounded to 8.5 cm. Cervix was easily dilated to size 19 dilator. The size 17 dilator seemed to go further than the previous dilators. The observer hysteroscope was inserted and good visualization was achieved using lactated Ringer's. The endometrial cavity was normal with no fibroids or significant polyps. There was a small perforation site in the superior midline of the fundus.  This was not bleeding and I felt it was ok to proceed with the procedure.  The hysteroscope was removed. The cervix was further dilated to a size 7 and size 8 Hegar dilator measuring the cervix a 3.5 cm. The NovaSure device was inserted and deployed properly. The CO2 test passed. Endometrial ablation was performed with the above-mentioned settings without difficulty. The device was then allowed to cool for about 30 seconds and was removed. Hysteroscopy  was then performed which revealed good global endometrial ablation and still no lesions.  The perforation site was still hemostatic.   Hysteroscope and fluid were then removed. The single-tooth tenaculum was removed from the cervix. Bleeding was controlled with pressure. All instruments were then removed from the vagina. The patient tolerated the procedure well and was taken to the recovery in stable condition. Counts were correct, she received no antibiotics, she had PAS hose on throughout the procedure.
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
## 2012-04-07 ENCOUNTER — Encounter (HOSPITAL_COMMUNITY): Payer: Self-pay | Admitting: Obstetrics and Gynecology

## 2012-04-08 NOTE — Telephone Encounter (Signed)
Called patient on Cell phone, VM box not set up  Called home phone, same recording - VM box is full and there is not enough space to leave a message

## 2012-06-25 ENCOUNTER — Other Ambulatory Visit: Payer: Self-pay

## 2012-06-25 MED ORDER — DULOXETINE HCL 60 MG PO CPEP: 60.0000 mg | ORAL_CAPSULE | Freq: Every day | ORAL | Status: DC

## 2012-06-25 NOTE — Telephone Encounter (Signed)
Patient aware rx sent to the pharmacy.

## 2012-07-24 ENCOUNTER — Encounter: Payer: Self-pay | Admitting: Lab

## 2012-07-25 ENCOUNTER — Ambulatory Visit (INDEPENDENT_AMBULATORY_CARE_PROVIDER_SITE_OTHER): Payer: BC Managed Care – PPO | Admitting: Internal Medicine

## 2012-07-25 ENCOUNTER — Encounter: Payer: Self-pay | Admitting: Internal Medicine

## 2012-07-25 VITALS — BP 120/80 | HR 77 | Temp 98.0°F | Resp 16 | Wt 157.0 lb

## 2012-07-25 DIAGNOSIS — R0989 Other specified symptoms and signs involving the circulatory and respiratory systems: Secondary | ICD-10-CM

## 2012-07-25 DIAGNOSIS — R05 Cough: Secondary | ICD-10-CM

## 2012-07-25 DIAGNOSIS — R42 Dizziness and giddiness: Secondary | ICD-10-CM

## 2012-07-25 DIAGNOSIS — K219 Gastro-esophageal reflux disease without esophagitis: Secondary | ICD-10-CM

## 2012-07-25 DIAGNOSIS — F458 Other somatoform disorders: Secondary | ICD-10-CM

## 2012-07-25 LAB — CBC WITH DIFFERENTIAL/PLATELET
Basophils Absolute: 0 10*3/uL (ref 0.0–0.1)
Eosinophils Absolute: 0 10*3/uL (ref 0.0–0.7)
Eosinophils Relative: 0.3 % (ref 0.0–5.0)
HCT: 40.4 % (ref 36.0–46.0)
Lymphs Abs: 1.3 10*3/uL (ref 0.7–4.0)
MCHC: 33.7 g/dL (ref 30.0–36.0)
MCV: 96.6 fl (ref 78.0–100.0)
Monocytes Absolute: 0.3 10*3/uL (ref 0.1–1.0)
Neutrophils Relative %: 79 % — ABNORMAL HIGH (ref 43.0–77.0)
Platelets: 245 10*3/uL (ref 150.0–400.0)
RDW: 13 % (ref 11.5–14.6)
WBC: 7.9 10*3/uL (ref 4.5–10.5)

## 2012-07-25 MED ORDER — ESOMEPRAZOLE MAGNESIUM 40 MG PO CPDR: 40.0000 mg | DELAYED_RELEASE_CAPSULE | Freq: Every day | ORAL | Status: DC

## 2012-07-25 NOTE — Progress Notes (Signed)
Subjective:    Patient ID: Anne [name], female    DOB: [birth year], 47 y.o.   MRN: [id number]  HPI  She has several concerns; the most major concern is a globus sensation which has been present for several months. She describes some chest symptoms as "if my chest caving in". She questions developing respiratory symptoms for several months.  She describes dental pain and sore throat. She's had a nonproductive cough chiefly at night. She describes some sweating at night & ?chills from [facility].  She's had some dyspnea on exertion with her martial arts. This is not associated with wheezing. She has no history of asthma or exercise-induced bronchospasm.  She has been using Benadryl , over-the-counter decongestants and Mucinex as needed for which she feels are perennial allergy symptoms with partial benefit.  She's had similar symptoms in the past which appeared to be related to silent reflux. She describes intermittent dysphagia again for several months. She also describes nausea for the last week. Omeprazole OTC helps some. She smoked remotely less than 7 years ,less than one pack per day.  She's had some imbalance which he relates to position change such as spinning in martial arts. This is not related to changing position in bed , turning her head, or flexing/extending her neck. This is not associated with frank vertigo.   Review of Systems She has no significant extrinsic symptoms of itchy, watery eyes, sneezing.  She denies abdominal pain, unexplained weight loss, melena, or rectal bleeding.  She denies frontal headache, facial pain, nasal purulence, otic pain, or otic discharge. She has not had fever , but she felt hot.  She denies hearing loss or ringing in the ears.     Objective:   Physical Exam  Gen.: Healthy and well-nourished in appearance. Alert, appropriate and cooperative throughout exam.Appears younger than stated age  Head: Normocephalic without obvious abnormalities Eyes:  No corneal or conjunctival inflammation noted. FOV normal. Extraocular motion intact. Vision grossly normal without lenses Ears: External  ear exam reveals no significant lesions or deformities. Canals clear .TMs normal. Hearing is grossly normal bilaterally. Tuning fork exam is normal. Nose: External nasal exam reveals no deformity or inflammation. Nasal mucosa are pink and moist. No lesions or exudates noted.   Mouth: Oral mucosa and oropharynx reveal no lesions or exudates. Teeth in good repair. Neck: No deformities, masses, or tenderness noted. Range of motion normal Lungs: Normal respiratory effort; chest expands symmetrically. Lungs are clear to auscultation without rales, wheezes, or increased work of breathing. Heart: Normal rate and rhythm. Normal S1 and S2. No gallop, click, or rub.S4 w/o murmur. Abdomen: Bowel sounds normal; abdomen soft and nontender. No masses, organomegaly or hernias noted.                     Musculoskeletal/extremities: No clubbing, cyanosis, edema, or significant extremity  deformity noted. Range of motion normal .Tone & strength  Normal. Joints normal . Nail health good. Able to lie down & sit up w/o help.  Vascular: Carotid pulses are full and equal. No bruits present. Neurologic: Alert and oriented x3. Deep tendon reflexes symmetrical and normal.   Finger-nose testing and Romberg testing normal.     Skin: Intact without suspicious lesions or rashes. Lymph: No cervical, axillary lymphadenopathy present. Psych: Mood and affect are normal. Normally interactive  Assessment & Plan:  #1 silent reflux with globus and dysphagia. The significance of the latter discussed. She's only been on omeprazole one week;Nexium  30 minutes before breakfast  for 6-8 weeks. CBC and differential and stool cards will be recommended. If the dysphagia persists or progresses; upper endoscopy is  indicated  #2 possible extrinsic symptoms which are mild. Nasal hygiene interventions will be discussed. The nocturnal cough again is most likely related to reflux.    #3The positional imbalance unfortunately is most likely related to the rapid change in head position in martial arts. This is most likely in the ear dysfunction; extrinsic phenomenon could be playing a role. Fortunately there is no evidence of any neuromuscular deficit.

## 2012-07-25 NOTE — Patient Instructions (Addendum)
Plain Mucinex (NOT D) for thick secretions ;force NON dairy fluids .   Nasal cleansing in the shower as discussed with lather of mild shampoo.After 10 seconds wash off lather while  exhaling through nostrils. Make sure that all residual soap is removed to prevent irritation.  Fluticasone 1 spray in each nostril twice a day as needed. Use the "crossover" technique into opposite nostril spraying toward opposite ear @ 45 degree angle, not straight up into nostril.  Use a Neti pot daily only  as needed for significant sinus congestion; going from open side to congested side . Plain Allegra (NOT D )  160 daily , Loratidine 10 mg , OR Zyrtec 10 mg @ bedtime  as needed for itchy eyes & sneezing.   Reflux of gastric acid may be asymptomatic as this may occur mainly during sleep.The triggers for reflux  include stress; the "aspirin family" ; alcohol; peppermint; and caffeine (coffee, tea, cola, and chocolate). The aspirin family would include aspirin and the nonsteroidal agents such as ibuprofen &  Naproxen. Tylenol would not cause reflux. If having symptoms ; food & drink should be avoided for @ least 2 hours before going to bed.  Please review the record and make any corrections; share this with all medical staff seen. If you note change or progression in symptoms please contact us through My Chart ASAP. This will allow [place] to respond as quickly as possible and schedule appropriate studies (blood test or imaging).

## 2012-07-29 ENCOUNTER — Encounter: Payer: Self-pay | Admitting: Internal Medicine

## 2012-07-29 DIAGNOSIS — R0989 Other specified symptoms and signs involving the circulatory and respiratory systems: Secondary | ICD-10-CM

## 2012-07-29 DIAGNOSIS — K219 Gastro-esophageal reflux disease without esophagitis: Secondary | ICD-10-CM

## 2012-07-29 NOTE — Telephone Encounter (Signed)
Per Dr.Hopper ok to place referral  

## 2012-07-31 ENCOUNTER — Encounter: Payer: Self-pay | Admitting: Gastroenterology

## 2012-07-31 ENCOUNTER — Ambulatory Visit (INDEPENDENT_AMBULATORY_CARE_PROVIDER_SITE_OTHER): Payer: BC Managed Care – PPO | Admitting: Gastroenterology

## 2012-07-31 VITALS — BP 150/94 | HR 84 | Ht 66.75 in | Wt 156.2 lb

## 2012-07-31 DIAGNOSIS — F449 Dissociative and conversion disorder, unspecified: Secondary | ICD-10-CM

## 2012-07-31 DIAGNOSIS — R0989 Other specified symptoms and signs involving the circulatory and respiratory systems: Secondary | ICD-10-CM

## 2012-07-31 DIAGNOSIS — R05 Cough: Secondary | ICD-10-CM

## 2012-07-31 DIAGNOSIS — R079 Chest pain, unspecified: Secondary | ICD-10-CM

## 2012-07-31 DIAGNOSIS — K219 Gastro-esophageal reflux disease without esophagitis: Secondary | ICD-10-CM

## 2012-07-31 MED ORDER — SUCRALFATE 1 GM/10ML PO SUSP: 1.0000 g | Freq: Four times a day (QID) | ORAL | Status: DC

## 2012-07-31 NOTE — Progress Notes (Signed)
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 She gives a history of recurrent acid reflux for several years with previous PPI therapy as needed wiyh good symptom response.  Most of her problems have been substernal chest pain and burning, and also extra esophageal manifestations of acid reflux. .  She has now had at least one week of rather severe burning substernal chest pain, throat clearing, hoarseness, and a globus sensation in her throat.  She has taken Nexium one to 2 times a day with partial relief of her substernal pain.  She denies any cardiopulmonary symptoms.  It is of note that she uses Flonase nasal spray several times a week, but denies antibiotic use.  Patient also uses when necessary Mobic for neck pain.  She has not had previous upper GI series or endoscopic exams.  There is no history of Raynaud's phenomenon or collagen vascular disease.  She denies lower gastrointestinal or hepatobiliary complaints at this time.  Additionally the patient does have some coughing and throat clearing problems are more chronic nature.  The patient denies lower GI or hepatobiliary complaints this time.  Her appetite is good her weight is stable without any specific food intolerances.  She denies true dysphagia, melena or hematochezia.  He does not smoke, abuse ethanol, but does use when necessary NSAIDs.  I have reviewed this patient's present history, medical and surgical past history, allergies and medications.     ROS:   All systems were reviewed and are negative unless otherwise stated in the HPI.    Physical Exam: Blood pressure 150/94, pulse 84 and regular and weight 156 the BMI of 24.67. General well developed well nourished patient in no acute distress, appearing their stated age Eyes PERRLA, no icterus,  fundoscopic exam per opthamologist Skin no lesions noted Neck supple, no adenopathy, no thyroid enlargement, no tenderness.  Oral pharyngeal exam unremarkable without evidence of thrush or other lesions. Chest clear to percussion and auscultation Heart no significant murmurs, gallops or rubs noted Abdomen no hepatosplenomegaly masses or tenderness, BS normal. Extremities no acute joint lesions, edema, phlebitis or evidence of cellulitis. Neurologic patient oriented x 3, cranial nerves intact, no focal neurologic deficits noted. Psychological mental status normal and normal affect.  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  I've asked continue twice a day Nexium with when necessary Carafate suspension PC and each bedtime, have reviewed antireflux maneuvers, and have scheduled endoscopic exam.  She may need high-resolution esophageal manometry to exclude an associated esophageal motility disturbance.  Endoscopy, will also check to be sure that she does not have Candida esophagitis related to Flonase use.  Information concerning acid reflux, is etiology and management given to patient for review.  No diagnosis found.

## 2012-07-31 NOTE — Patient Instructions (Addendum)
You have been scheduled for an endoscopy with propofol. Please follow written instructions given to you at your visit today. If you use inhalers (even only as needed), please bring them with you on the day of your procedure. Your physician has requested that you go to www.startemmi.com and enter the access code given to you at your visit today. This web site gives a general overview about your procedure. However, you should still follow specific instructions given to you by our office regarding your preparation for the procedure.  Carafate was sent to your pharmacy, please take after meals and before bedtime.  Information on GERD is below. _________________________________________________________________________________________________________                                               We are excited to introduce MyChart, a new best-in-class service that provides you online access to important information in your electronic medical record. We want to make it easier for you to view your health information - all in one secure location - when and where you need it. We expect MyChart will enhance the quality of care and service we provide.  When you register for MyChart, you can:    View your test results.    Request appointments and receive appointment reminders via email.    Request medication renewals.    View your medical history, allergies, medications and immunizations.    Communicate with your physician's office through a password-protected site.    Conveniently print information such as your medication lists.  To find out if MyChart is right for you, please talk to a member of our clinical staff today. We will gladly answer your questions about this free health and wellness tool.  If you are age 48 or older and want a member of your family to have access to your record, you must provide written consent by completing a proxy form available at our office. Please speak to our  clinical staff about guidelines regarding accounts for patients younger than age 48.  As you activate your MyChart account and need any technical assistance, please call the MyChart technical support line at [phone]-CHART [phone]) or email your question to [email or web address]. If you email your question(s), please include your name, a return phone number and the best time to reach you.  If you have non-urgent health-related questions, you can send a message to our office through MyChart at [email or web address]. If you have a medical emergency, call 911.  Thank you for using MyChart as your new health and wellness resource!   MyChart licensed from [employer],  4540-9811. Patents Pending. _____________________________________________________________________________________  Diet for Gastroesophageal Reflux Disease, Adult Reflux (acid reflux) is when acid from your stomach flows up into the esophagus. When acid comes in contact with the esophagus, the acid causes irritation and soreness (inflammation) in the esophagus. When reflux happens often or so severely that it causes damage to the esophagus, it is called gastroesophageal reflux disease (GERD). Nutrition therapy can help ease the discomfort of GERD. FOODS OR DRINKS TO AVOID OR LIMIT  Smoking or chewing tobacco. Nicotine is one of the most potent stimulants to acid production in the gastrointestinal tract.  Caffeinated and decaffeinated coffee and black tea.  Regular or low-calorie carbonated beverages or energy drinks (caffeine-free carbonated beverages are allowed).   Strong spices, such as black pepper, white pepper, red  pepper, cayenne, curry powder, and chili powder.  Peppermint or spearmint.  Chocolate.  High-fat foods, including meats and fried foods. Extra added fats including oils, butter, salad dressings, and nuts. Limit these to less than 8 tsp per day.  Fruits and vegetables if they are not  tolerated, such as citrus fruits or tomatoes.  Alcohol.  Any food that seems to aggravate your condition. If you have questions regarding your diet, call your caregiver or a registered dietitian. OTHER THINGS THAT MAY HELP GERD INCLUDE:   Eating your meals slowly, in a relaxed setting.  Eating 5 to 6 small meals per day instead of 3 large meals.  Eliminating food for a period of time if it causes distress.  Not lying down until 3 hours after eating a meal.  Keeping the head of your bed raised 6 to 9 inches (15 to 23 cm) by using a foam wedge or blocks under the legs of the bed. Lying flat may make symptoms worse.  Being physically active. Weight loss may be helpful in reducing reflux in overweight or obese adults.  Wear loose fitting clothing EXAMPLE MEAL PLAN This meal plan is approximately 2,000 calories based on https://www.bernard.org/ meal planning guidelines. Breakfast   cup cooked oatmeal.  1 cup strawberries.  1 cup low-fat milk.  1 oz almonds. Snack  1 cup cucumber slices.  6 oz yogurt (made from low-fat or fat-free milk). Lunch  2 slice whole-wheat bread.  2 oz sliced [place].  2 tsp mayonnaise.  1 cup blueberries.  1 cup snap peas. Snack  6 whole-wheat crackers.  1 oz string cheese. Dinner   cup brown rice.  1 cup mixed veggies.  1 tsp olive oil.  3 oz grilled fish. Document Released: 03/19/2005 Document Revised: 06/11/2011 Document Reviewed: 02/02/2011 [facility] Patient Information 2013 [place], [place].

## 2012-08-08 ENCOUNTER — Telehealth: Payer: Self-pay | Admitting: General Practice

## 2012-08-08 ENCOUNTER — Ambulatory Visit (AMBULATORY_SURGERY_CENTER): Payer: BC Managed Care – PPO | Admitting: Gastroenterology

## 2012-08-08 ENCOUNTER — Encounter: Payer: Self-pay | Admitting: Gastroenterology

## 2012-08-08 VITALS — BP 154/80 | HR 74 | Temp 97.9°F | Resp 21 | Ht 67.0 in | Wt 156.0 lb

## 2012-08-08 DIAGNOSIS — R079 Chest pain, unspecified: Secondary | ICD-10-CM

## 2012-08-08 DIAGNOSIS — K2270 Barrett's esophagus without dysplasia: Secondary | ICD-10-CM

## 2012-08-08 DIAGNOSIS — R05 Cough: Secondary | ICD-10-CM

## 2012-08-08 DIAGNOSIS — R059 Cough, unspecified: Secondary | ICD-10-CM

## 2012-08-08 DIAGNOSIS — K219 Gastro-esophageal reflux disease without esophagitis: Secondary | ICD-10-CM

## 2012-08-08 MED ORDER — SODIUM CHLORIDE 0.9 % IV SOLN: 500.0000 mL | INTRAVENOUS | Status: DC

## 2012-08-08 NOTE — Patient Instructions (Addendum)

## 2012-08-08 NOTE — Telephone Encounter (Signed)
1.) Called patient on cell, unable to leave message due to no VM box. 2.) I left message on machine at work number for patient to return call when available  3.) Called Home number listed, mailbox full, unable to leave message

## 2012-08-08 NOTE — Telephone Encounter (Signed)
Sheena wanted to notify Dr. Alwyn Ren pt had returned IFOB but did not use it, test came back with no sample on it.

## 2012-08-08 NOTE — Telephone Encounter (Signed)
Cards mailed to patient.

## 2012-08-08 NOTE — Progress Notes (Signed)
Patient did not experience any of the following events: a burn prior to discharge; a fall within the facility; wrong site/side/patient/procedure/implant event; or a hospital transfer or hospital admission upon discharge from the facility. (G8907) Patient did not have preoperative order for IV antibiotic SSI prophylaxis. (G8918)  

## 2012-08-08 NOTE — Progress Notes (Signed)
Called to room to assist during endoscopic procedure.  Patient ID and intended procedure confirmed with present staff. Received instructions for my participation in the procedure from the performing physician. ewm 

## 2012-08-08 NOTE — Telephone Encounter (Signed)
Pt called back advise her Ifob was incomplete. Per Pt she never completed test she left it in the labs. Advise Pt we can mail one to her for completions. Pt can be reached at  [phone] if there is anything else we need from her.

## 2012-08-08 NOTE — Op Note (Signed)
[facility] [address] [place], [address]   ENDOSCOPY PROCEDURE REPORT  PATIENT: Anne, [name]  MR#: [id number] BIRTHDATE: 1966/02/23 , 46  yrs. old GENDER: Female ENDOSCOPIST:Anisia Leija Hale Bogus, MD, [facility] Graham REFERRED BY: Marga Melnick, M.D. PROCEDURE DATE:  08/08/2012 PROCEDURE:   EGD w/ biopsy ASA CLASS:    Class II INDICATIONS: Chest pain and history of GERD. MEDICATION: propofol (Diprivan) 200mg  IV TOPICAL ANESTHETIC:   Cetacaine Spray  DESCRIPTION OF PROCEDURE:   After the risks and benefits of the procedure were explained, informed consent was obtained.  The LB GIF-H180 [id number]  endoscope was introduced through the mouth  and advanced to the second portion of the duodenum .  The instrument was slowly withdrawn as the mucosa was fully examined.      DUODENUM: The duodenal mucosa showed no abnormalities in the bulb and second portion of the duodenum.  STOMACH: The mucosa of the stomach appeared normal.  ESOPHAGUS: There was evidence of suspected Barrett's esophagus in the lower third of the esophagus.See pictures.NBI exam also c//w Barrett's epithelium  Multiple biopsies were performed using cold forceps.No stricture or erosions noted,no Candida plaques visualized.  Sample sent for histology exam.    Retroflexed views revealed a small hiatial hernia..    The scope was then withdrawn from the patient and the procedure completed.  COMPLICATIONS: There were no complications.   ENDOSCOPIC IMPRESSION: 1.   The duodenal mucosa showed no abnormalities in the bulb and second portion of the duodenum 2.   The mucosa of the stomach appeared normal 3.   There was evidence of suspected Barrett's esophagus; multiple biopsies  done.Dx. here c/w chronic GERD,R/O dysplasia. 4.   Vocal cords and upper esophagus appear normal.  RECOMMENDATIONS: 1.  Await pathology results 2.  Continue current medications 3.  Continue PPI 4.  Office  office appointment  for 2 weeks    _______________________________ eSigned:  Mardella Layman, MD, [facility] 08/08/2012 3:51 PM   standard discharge   PATIENT NAME:  Anne, [name] MR#: [id number]

## 2012-08-11 ENCOUNTER — Encounter: Payer: Self-pay | Admitting: Gastroenterology

## 2012-08-11 ENCOUNTER — Telehealth: Payer: Self-pay | Admitting: *Deleted

## 2012-08-11 NOTE — Telephone Encounter (Signed)
Mailbox not set up, follow-up  

## 2012-08-12 ENCOUNTER — Telehealth: Payer: Self-pay | Admitting: *Deleted

## 2012-08-12 NOTE — Telephone Encounter (Signed)
Per Dr Jarold Motto, if the BID Nexium hasn't helped, she probably needs to see a GI doc at [facility] or [facility] that specializes in thermal ablation. Pt asked how long to give the Nexium and I informed her at least 2 weeks. She has a f/u on 08/22/12 with Dr Jarold Motto and will try the Nexium until then.

## 2012-08-12 NOTE — Telephone Encounter (Signed)
[facility] referral is OK

## 2012-08-12 NOTE — Telephone Encounter (Signed)
Is path back ???

## 2012-08-12 NOTE — Telephone Encounter (Signed)
-----   Message from Mineral Poteat to Mardella Layman, MD sent at 08/11/2012 7:54 PM -----    I am having constant pain in chest/lung area; throat sore but not as painful as chest/lung area. Miserable. Sorry. Shawntee      Path is back revealing Barrett's. Pt is no better. Per notes, she is to take Nexium BID and Carafate. lmom for pt to call back; there is no VM box to leave a message on cell phone. Sent a reply back asking pt on"pt advice request" to call me Dr Jarold Motto, any new meds with dx of Barrett's? Thanks.

## 2012-08-12 NOTE — Telephone Encounter (Signed)
Yes it says Barrett's.

## 2012-08-13 ENCOUNTER — Encounter: Payer: Self-pay | Admitting: Gastroenterology

## 2012-08-20 ENCOUNTER — Encounter: Payer: Self-pay | Admitting: Internal Medicine

## 2012-08-22 ENCOUNTER — Telehealth: Payer: Self-pay | Admitting: *Deleted

## 2012-08-22 ENCOUNTER — Ambulatory Visit (INDEPENDENT_AMBULATORY_CARE_PROVIDER_SITE_OTHER): Payer: BC Managed Care – PPO | Admitting: Gastroenterology

## 2012-08-22 ENCOUNTER — Encounter: Payer: Self-pay | Admitting: Gastroenterology

## 2012-08-22 VITALS — BP 134/90 | HR 82 | Ht 67.0 in | Wt 149.2 lb

## 2012-08-22 DIAGNOSIS — R05 Cough: Secondary | ICD-10-CM

## 2012-08-22 DIAGNOSIS — R0989 Other specified symptoms and signs involving the circulatory and respiratory systems: Secondary | ICD-10-CM

## 2012-08-22 DIAGNOSIS — F458 Other somatoform disorders: Secondary | ICD-10-CM

## 2012-08-22 DIAGNOSIS — R079 Chest pain, unspecified: Secondary | ICD-10-CM

## 2012-08-22 DIAGNOSIS — K2270 Barrett's esophagus without dysplasia: Secondary | ICD-10-CM

## 2012-08-22 DIAGNOSIS — K219 Gastro-esophageal reflux disease without esophagitis: Secondary | ICD-10-CM

## 2012-08-22 MED ORDER — TRAMADOL HCL 50 MG PO TABS: 50.0000 mg | ORAL_TABLET | Freq: Four times a day (QID) | ORAL | Status: DC | PRN

## 2012-08-22 MED ORDER — ESOMEPRAZOLE MAGNESIUM 40 MG PO CPDR: 40.0000 mg | DELAYED_RELEASE_CAPSULE | Freq: Two times a day (BID) | ORAL | Status: DC

## 2012-08-22 NOTE — Addendum Note (Signed)
Addended by: Ok Anis A on: 08/22/2012 11:26 AM   Modules accepted: Orders

## 2012-08-22 NOTE — Progress Notes (Addendum)
This is a 47 year old Caucasian female with atypical chest pain he recently underwent endoscopy which showed Barrett's mucosa in the distal several centimeters of her esophagus.  There is no evidence of dysplasia.  Endoscopy was otherwise normal except for small sliding hiatal hernia.  She continues with chest pain, shortness of breath, some vague swallowing difficulties.  She has been on twice a day Nexium and liquid Carafate without improvement.  In retrospect, her symptoms have been present for several years.  Cardiopulmonary workup otherwise been unremarkable by Dr. Marga Melnick.  Current Medications, Allergies, Past Medical History, Past Surgical History, Family History and Social History were reviewed in [employer] record.  ROS: All systems were reviewed and are negative unless otherwise stated in the HPI.          Physical Exam: Blood pressure 134/90, pulse 82 and regular and weight 149 the BMI of 23.36.  Cannot appreciate stigmata of chronic liver disease, thyromegaly or lymphadenopathy.  Oral pharyngeal exam is unremarkable.  Chest is clear to percussion endoscopy patient.  She is in a regular rhythm without murmurs gallops or rubs.  Abdominal exam shows no organomegaly, masses or tenderness.  Peripheral extremities are unremarkable and mental status is normal.   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  She is interested in tertiary medical center consultation and perhaps ablation therapy of her Barrett's mucosa.  I have referred her to Dr. Alvia Grove in the gastroenterology Department at [facility] for evaluation and therapy..  I suspect that he  perform esophageal manometry , repeat endoscopic exam, and discuss possible Barrett's ablation therapy.  We will continue current medications including when  necessary liquid Carafate, and I have given her a prescription for tramadol 50 mg one to 2 tablets every 6 hours as needed for pain.  She is on Cymbalta 60 mg a day and when necessary Xanax also.  Please copy her primary care physician, referring physician, and pertinent subspecialists.             No diagnosis found.

## 2012-08-22 NOTE — Patient Instructions (Addendum)
  We have sent the following medications to your pharmacy for you to pick up at your convenience: Tramadol 50 mg, please take as directed.  Dr. Norval Gable nurse Aram Beecham will refer you to [facility]. You will be contacted with your appointment information.  Your Nexium was increased to one capsule twice daily. New prescription was sent to your pharmacy.  _____________________________________________________________________________________________________________________                                               We are excited to introduce MyChart, a new best-in-class service that provides you online access to important information in your electronic medical record. We want to make it easier for you to view your health information - all in one secure location - when and where you need it. We expect MyChart will enhance the quality of care and service we provide.  When you register for MyChart, you can:    View your test results.    Request appointments and receive appointment reminders via email.    Request medication renewals.    View your medical history, allergies, medications and immunizations.    Communicate with your physician's office through a password-protected site.    Conveniently print information such as your medication lists.  To find out if MyChart is right for you, please talk to a member of our clinical staff today. We will gladly answer your questions about this free health and wellness tool.  If you are age 20 or older and want a member of your family to have access to your record, you must provide written consent by completing a proxy form available at our office. Please speak to our clinical staff about guidelines regarding accounts for patients younger than age 52.  As you activate your MyChart account and need any technical assistance, please call the MyChart technical support line at (336) [phone]-CHART [phone]) or email your question to [email or web address].  If you email your question(s), please include your name, a return phone number and the best time to reach you.  If you have non-urgent health-related questions, you can send a message to our office through MyChart at [email or web address]. If you have a medical emergency, call 911.  Thank you for using MyChart as your new health and wellness resource!   MyChart licensed from [employer],  [id number]. Patents Pending.

## 2012-08-22 NOTE — Telephone Encounter (Signed)
Per Dr Jarold Motto, refer pt to Dr Alvia Grove at [facility]. Called his ofc, [phone]. I faxed infor to [phone] at [facility]. Dr Enrigue Catena will review the info and decide on an appt. He may want the path slides before pt is seen.

## 2012-09-15 ENCOUNTER — Other Ambulatory Visit: Payer: Self-pay | Admitting: Gastroenterology

## 2012-10-06 ENCOUNTER — Encounter: Payer: Self-pay | Admitting: Gastroenterology

## 2012-10-06 ENCOUNTER — Telehealth: Payer: Self-pay | Admitting: *Deleted

## 2012-10-06 MED ORDER — ESOMEPRAZOLE MAGNESIUM 40 MG PO CPDR: 40.0000 mg | DELAYED_RELEASE_CAPSULE | Freq: Two times a day (BID) | ORAL | Status: DC

## 2012-10-06 NOTE — Telephone Encounter (Signed)
Called Marchelle Folks back and left message in her personal voice mail to make sure she received the patients medical records for her appointment  Number is [phone] Waiting on call back, will check back in because patient is waiting

## 2012-10-06 NOTE — Telephone Encounter (Signed)
Aram Beecham Dr. Norval Gable nurse sent records over to Dr. Bryson Dames office, same day as your office visit. Dr. Enrigue Catena is reviewing the records and has requested more. Once all records are received they will call you with an office appointment and I will continue to check for you. I do apologize for the delay and inconvenience.    I will send in a new prescription for Nexium to [facility]

## 2012-10-07 ENCOUNTER — Encounter: Payer: Self-pay | Admitting: Gastroenterology

## 2012-11-02 ENCOUNTER — Other Ambulatory Visit: Payer: Self-pay | Admitting: Gastroenterology

## 2012-12-23 ENCOUNTER — Other Ambulatory Visit: Payer: Self-pay | Admitting: Internal Medicine

## 2012-12-23 ENCOUNTER — Other Ambulatory Visit: Payer: Self-pay | Admitting: Gastroenterology

## 2012-12-23 NOTE — Telephone Encounter (Signed)
Yes, refill x1 is fine- not sure Tramadol can be e-prescribed now-

## 2012-12-23 NOTE — Telephone Encounter (Signed)
Anne [name], OK to refill tramadol; it looks like she only has had the script for 1 time? She is seeing Dr Philmore Pali at [facility] for atypical chest pain. Thanks.

## 2012-12-23 NOTE — Telephone Encounter (Signed)
Is this ok to refill?  

## 2012-12-24 NOTE — Telephone Encounter (Signed)
Med filled.  

## 2013-01-03 ENCOUNTER — Other Ambulatory Visit: Payer: Self-pay | Admitting: Gastroenterology

## 2013-02-05 ENCOUNTER — Other Ambulatory Visit: Payer: Self-pay

## 2013-02-22 ENCOUNTER — Other Ambulatory Visit: Payer: Self-pay | Admitting: Gastroenterology

## 2013-02-23 NOTE — Telephone Encounter (Signed)
Is it ok to refill tramadol?

## 2013-04-02 HISTORY — PX: ANTERIOR CERVICAL DECOMP/DISCECTOMY FUSION: SHX1161

## 2013-04-06 ENCOUNTER — Other Ambulatory Visit: Payer: Self-pay | Admitting: Internal Medicine

## 2013-04-06 NOTE — Telephone Encounter (Signed)
Fluticasone refilled per protocol. JG//CMA 

## 2013-05-04 ENCOUNTER — Encounter: Payer: Self-pay | Admitting: Internal Medicine

## 2013-05-04 ENCOUNTER — Other Ambulatory Visit (INDEPENDENT_AMBULATORY_CARE_PROVIDER_SITE_OTHER): Payer: BC Managed Care – PPO

## 2013-05-04 ENCOUNTER — Ambulatory Visit (INDEPENDENT_AMBULATORY_CARE_PROVIDER_SITE_OTHER): Payer: BC Managed Care – PPO | Admitting: Internal Medicine

## 2013-05-04 VITALS — BP 130/70 | HR 78 | Temp 98.8°F | Wt 146.8 lb

## 2013-05-04 DIAGNOSIS — M419 Scoliosis, unspecified: Secondary | ICD-10-CM

## 2013-05-04 DIAGNOSIS — M545 Low back pain, unspecified: Secondary | ICD-10-CM

## 2013-05-04 DIAGNOSIS — M4120 Other idiopathic scoliosis, site unspecified: Secondary | ICD-10-CM

## 2013-05-04 DIAGNOSIS — G5600 Carpal tunnel syndrome, unspecified upper limb: Secondary | ICD-10-CM

## 2013-05-04 DIAGNOSIS — M533 Sacrococcygeal disorders, not elsewhere classified: Secondary | ICD-10-CM

## 2013-05-04 NOTE — Progress Notes (Signed)
Pre visit review using our clinic review tool, if applicable. No additional management support is needed unless otherwise documented below in the visit note. 

## 2013-05-04 NOTE — Addendum Note (Signed)
Addended by: Verdie ShireBAYNES, ANGELA M on: 05/04/2013 05:06 PM   Modules accepted: Orders

## 2013-05-04 NOTE — Progress Notes (Signed)
   Subjective:    Patient ID: Anne [name], female    DOB: 04/01/1966, 48 y.o.   MRN: [id number]  HPI  She has had intermittent lower spine pain mainly in the lumbosacral/coccyx area over the last 6 months. It is worse if she sits in court or at sporting events.   It is described as "heavy" and "almost sharp ". Once present it will last hours. There was no trigger or injury prior to this onset  Cold packs are of some benefit. Aleve does not help.  She has been receiving chiropractic manipulation; last treatment was in December. She's also had traction which provides several days of relief  Unrelated is new onset of tingling in the upper extremities, right greater than left from the elbows down to the entire hands.  She has a past history of cervical pain but this responded to deep tissue massage  PMH of coccygeal injury 2011   Review of Systems  She denies fever, chills, sweats, or unexplained weight loss.  She has no chest pain, palpitations, or dyspnea  She also denies abdominal pain, melena, or rectal bleeding  She has no weakness or numbness in extremities  There is no change in color or temperature of the skin over the area of pain  She denies incontinence of urine or stool  She has no abnormal bruising or bleeding.          Objective:   Physical Exam Gen.: Healthy and well-nourished in appearance. Alert, appropriate and cooperative throughout exam.Appears younger than stated age  Head: Normocephalic without obvious abnormalities Eyes: No corneal or conjunctival inflammation noted.  Neck: No deformities, masses, or tenderness noted. Range of motion & Thyroid normal.  Abdomen: Bowel sounds normal; abdomen soft and nontender. No masses, organomegaly or hernias noted.Aorta palpable ; no AAA                                 Musculoskeletal/extremities: No deformity or scoliosis noted of  the thoracic or lumbar spine.   No clubbing, cyanosis, edema, or significant  extremity  deformity noted. Range of motion normal .Tone & strength normal. Hand joints normal . Fingernail health good. Able to lie down & sit up w/o help. Negative SLR bilaterally to 90 degrees Vascular: Carotid, radial artery, dorsalis pedis and  posterior tibial pulses are full and equal. No bruits present. Neurologic: Alert and oriented x3. Deep tendon reflexes symmetrical and normal.  Gait normal  including heel & toe walking . Tinel's is equivalent       Skin: Intact without suspicious lesions or rashes. Lymph: No cervical, axillary lymphadenopathy present. Psych: Mood and affect are normal. Normally interactive                                                                                        Assessment & Plan:  #1 lumbosacral pain in the context of documented scoliosis   #2 Coccyx pain  #3 carpal tunnel syndrome  Plan see orders

## 2013-05-04 NOTE — Patient Instructions (Addendum)
Use a rubber doughnut when sitting for prolonged period time to prevent discomfort in the coccyx. Soaking  in hot tub will also provide relief. The best exercises for the low back include freestyle swimming, stretch aerobics, and yoga.Cybex & Nautilus rather than dead weights are better for the back. Go to Web M.D. for information on Carpal Tunnel Syndrome. If symptoms persist or progress; nerve conduction/EMG studies would be indicated. If these were abnormal, Hand Surgery referral would be indicated. If symptoms persist or progress, I recommend Physical Therapy or non operative Orthopedic Back Specialist consultation. Report warning signs of  weakness, numbness & tingling, or pain radiating down legs.

## 2013-05-05 LAB — TSH: TSH: 1.37 u[IU]/mL (ref 0.35–5.50)

## 2013-05-13 ENCOUNTER — Other Ambulatory Visit: Payer: Self-pay | Admitting: Gastroenterology

## 2013-05-13 NOTE — Telephone Encounter (Signed)
Is it ok to fill tramadol?

## 2013-05-14 MED ORDER — TRAMADOL HCL 50 MG PO TABS: 50.0000 mg | ORAL_TABLET | Freq: Four times a day (QID) | ORAL | Status: DC | PRN

## 2013-05-14 NOTE — Addendum Note (Signed)
Addended by: Ok AnisSMITH, Quincy Prisco A on: 05/14/2013 08:35 AM   Modules accepted: Orders

## 2013-05-22 ENCOUNTER — Telehealth: Payer: Self-pay | Admitting: Gastroenterology

## 2013-05-22 NOTE — Telephone Encounter (Signed)
According to note from 07/2012 patient was to be referred to [facility] Left message for patient to call back

## 2013-05-26 NOTE — Telephone Encounter (Signed)
Former patient of Dr. Jarold MottoPatterson.  Had EGD in 07/2012 with barrett's - no dysplasia.  Dr. Jarold MottoPatterson recommended patient go to Dr. Enrigue CatenaShaheen at for eval of possible ablation therapy .  [facility].  She is interested in now seeing Dr. Enrigue CatenaShaheen.  Dr. Rhea BeltonPyrtle she would like to continue her care here with you.  Will you please review notes and endo from May and June 2014 about referrals.  Recall is in for 08/2015.  How do you want me to proceed from here?  Do you want me to reinitiate referral? Or office visit?

## 2013-05-26 NOTE — Telephone Encounter (Signed)
Left message for patient to call back  

## 2013-05-26 NOTE — Telephone Encounter (Signed)
EGD reviewed Short segment, nondysplastic Barrett's in May 2014 Options include repeat EGD in May 20 15th for reassessment and repeat biopsies versus referral to [facility] for [facility].  In general of ablative therapies are not performed for nondysplastic Barrett's, but can be considered in the correct patient, i.e. younger patients such as Anne [name].  This decision will be his as he is a ArtistBarrett's expert and proficient in ablative therapy. If Anne [name] desires Anne [name] can be referred now

## 2013-05-27 NOTE — Telephone Encounter (Signed)
I called and spoke with the patient and the would like to wait and have EGD repeat here in may then decide about referral at that time.  I have placed a reminder to myself to call her in April to set up procedure for May.

## 2013-06-11 ENCOUNTER — Encounter: Payer: Self-pay | Admitting: Internal Medicine

## 2013-06-11 ENCOUNTER — Other Ambulatory Visit: Payer: Self-pay | Admitting: Internal Medicine

## 2013-06-11 DIAGNOSIS — G5603 Carpal tunnel syndrome, bilateral upper limbs: Secondary | ICD-10-CM

## 2013-06-12 ENCOUNTER — Encounter: Payer: Self-pay | Admitting: Internal Medicine

## 2013-06-25 ENCOUNTER — Other Ambulatory Visit: Payer: Self-pay | Admitting: Gastroenterology

## 2013-06-25 ENCOUNTER — Other Ambulatory Visit: Payer: Self-pay | Admitting: Internal Medicine

## 2013-06-26 ENCOUNTER — Other Ambulatory Visit: Payer: Self-pay | Admitting: Gastroenterology

## 2013-06-26 NOTE — Telephone Encounter (Signed)
Spoke with the pt and she stated that the refill request was done in error.  She is no longer taking the Cymbalta.   Refill request denied.//AB/CMA

## 2013-06-26 NOTE — Telephone Encounter (Signed)
LMOM (9:35am) asking the pt to RTC regarding refill request for the Cymbalta.  At last visit the Cymbalta was taking off the pt's med list.//AB/CMA

## 2013-06-27 ENCOUNTER — Other Ambulatory Visit: Payer: Self-pay | Admitting: Gastroenterology

## 2013-07-02 ENCOUNTER — Telehealth: Payer: Self-pay

## 2013-07-02 NOTE — Telephone Encounter (Signed)
Message copied by Annett FabianJONES, Aleanna Menge L on Thu Jul 02, 2013 10:42 AM ------      Message from: Annett FabianJONES, Chi Garlow L      Created: Wed May 27, 2013  4:12 PM       See phone note from 05/27/13.  Needs EGD [facility] for recall barrett's with Pyrtle ------

## 2013-07-02 NOTE — Telephone Encounter (Signed)
Patient is scheduled for EGD 08/12/13 10:00 and pre-visit on 08/05/13 1:30

## 2013-07-15 ENCOUNTER — Other Ambulatory Visit: Payer: Self-pay | Admitting: Gastroenterology

## 2013-07-15 NOTE — Telephone Encounter (Signed)
Patient has an EGD scheduled with you for May. Is it ok to send refill of Tramadol?

## 2013-07-16 NOTE — Telephone Encounter (Signed)
Ok for refill? 

## 2013-08-05 ENCOUNTER — Ambulatory Visit (AMBULATORY_SURGERY_CENTER): Payer: BC Managed Care – PPO | Admitting: *Deleted

## 2013-08-05 VITALS — Ht 67.0 in | Wt 139.6 lb

## 2013-08-05 DIAGNOSIS — K2270 Barrett's esophagus without dysplasia: Secondary | ICD-10-CM

## 2013-08-05 NOTE — Progress Notes (Signed)
Patient denies any allergies to eggs or soy. Patient denies any problems with anesthesia/sedation. Patient denies any oxygen use at home and does not take any diet/weight loss medications. EMMI education assisgned to patient on EGD.

## 2013-08-18 ENCOUNTER — Encounter: Payer: Self-pay | Admitting: Internal Medicine

## 2013-08-18 ENCOUNTER — Ambulatory Visit (AMBULATORY_SURGERY_CENTER): Payer: BC Managed Care – PPO | Admitting: Internal Medicine

## 2013-08-18 ENCOUNTER — Telehealth: Payer: Self-pay

## 2013-08-18 VITALS — BP 135/61 | HR 68 | Temp 97.7°F | Resp 34 | Ht 67.0 in | Wt 139.0 lb

## 2013-08-18 DIAGNOSIS — K2990 Gastroduodenitis, unspecified, without bleeding: Secondary | ICD-10-CM

## 2013-08-18 DIAGNOSIS — K2270 Barrett's esophagus without dysplasia: Secondary | ICD-10-CM | POA: Insufficient documentation

## 2013-08-18 DIAGNOSIS — K2970 Gastritis, unspecified, without bleeding: Secondary | ICD-10-CM

## 2013-08-18 MED ORDER — TRAMADOL HCL 50 MG PO TABS: 50.0000 mg | ORAL_TABLET | Freq: Two times a day (BID) | ORAL | Status: DC

## 2013-08-18 MED ORDER — ESOMEPRAZOLE MAGNESIUM 40 MG PO CPDR: 40.0000 mg | DELAYED_RELEASE_CAPSULE | Freq: Two times a day (BID) | ORAL | Status: DC

## 2013-08-18 MED ORDER — SODIUM CHLORIDE 0.9 % IV SOLN: 500.0000 mL | INTRAVENOUS | Status: DC

## 2013-08-18 NOTE — Telephone Encounter (Signed)
Message copied by Annett FabianJONES, SHERI L on Tue Aug 18, 2013  1:36 PM ------      Message from: Beverley FiedlerPYRTLE, JAY M      Created: Tue Aug 18, 2013 12:07 PM       Former Jarold MottoPatterson patient      As[facility] regarding Barrett's esophagus, GERD, and atypical CP., was put into place by Jarold MottoPatterson, but never occurred  Requesting [facility].  Appt best in Aug 2015 due to upcoming cervical neck surgery.      Thanks.      JMP ------

## 2013-08-18 NOTE — Op Note (Signed)
[facility] [address] [place], [id number]   ENDOSCOPY PROCEDURE REPORT  PATIENT: Anne [name], Anne N.  MR#: [id number] BIRTHDATE: [birth year] , 47  yrs. old GENDER: Female ENDOSCOPIST: Beverley FiedlerJay M Lavere Stork, MD PROCEDURE DATE:  08/18/2013 PROCEDURE:  EGD w/ biopsy ASA CLASS:     Class II INDICATIONS:  follow up of Barrett's esophagus. MEDICATIONS: MAC sedation, administered by CRNA and Propofol (Diprivan) 290 mg IV TOPICAL ANESTHETIC: none  DESCRIPTION OF PROCEDURE: After the risks benefits and alternatives of the procedure were thoroughly explained, informed consent was obtained.  The LB [id number] endoscope was introduced through the mouth and advanced to the second portion of the duodenum. Without limitations.  The instrument was slowly withdrawn as the mucosa was fully examined.     ESOPHAGUS: There was evidence of short segment Barrett's esophagus 38 cm from the incisors.  Multiple biopsies were performed using cold forceps. No areas of nodularity.  [place] Criteria C1M2.  The remaining esophageal mucosa was normal.  STOMACH: Mild gastritis (inflammation) was found in the gastric body and gastric antrum.  Biopsies were taken in the gastric body, antrum and angularis.   A 2 cm hiatal hernia was noted.  DUODENUM: The duodenal mucosa showed no abnormalities in the bulb and second portion of the duodenum. Retroflexed views revealed a hiatal hernia.     The scope was then withdrawn from the patient and the procedure completed.  COMPLICATIONS: There were no complications. ENDOSCOPIC IMPRESSION: 1.   There was evidence of Barrett's esophagus; multiple biopsies 2.   Gastritis (inflammation) was found in the gastric body and gastric antrum; biopsies were taken in the antrum and angularis 3.   2 cm hiatal hernia 4.   The duodenal mucosa showed no abnormalities in the bulb and second portion of the duodenum  RECOMMENDATIONS: 1.  Await biopsy results 2.   Continue taking your PPI (antiacid medicine) once daily.  It is best to be taken 20-30 minutes prior to breakfast meal.  eSigned:  Beverley FiedlerJay M Robbie Nangle, MD 08/18/2013 10:12 AM CC:The Patient and Pecola LawlessWilliam F Hopper, MD

## 2013-08-18 NOTE — Progress Notes (Signed)
Pr was tearfull while in the recovery room.  She denied pain.  Dr. Rhea BeltonPyrtle came in to speak with the pt.  She requested 3 monthe refill of nexium sent to mail order and also tramadol 50 mg refill.  Dr. Rhea BeltonPyrtle okay tramadol 1 2 tab dail as needed no refill and script was given to pt.  Nexiunm was sent e scribe.  [facility].  Per Dr. Rhea BeltonPyrtle will have Sheri follow this up. Maw No complaints on discharge.  Pt stopped crying at discharge also. Maw

## 2013-08-18 NOTE — Telephone Encounter (Signed)
Records sent to Dr. Bryson DamesShaheen's office for review and scheduling I have sent a message to the patient via my chart to be expecting a call from Dr. Bryson DamesShaheen's office to schedule with her directly.

## 2013-08-18 NOTE — Patient Instructions (Signed)
YOU HAD AN ENDOSCOPIC PROCEDURE TODAY AT THE [facility]: Refer to the procedure report that was given to you for any specific questions about what was found during the examination.  If the procedure report does not answer your questions, please call your gastroenterologist to clarify.  If you requested that your care partner not be given the details of your procedure findings, then the procedure report has been included in a sealed envelope for you to review at your convenience later.  YOU SHOULD EXPECT: Some feelings of bloating in the abdomen. Passage of more gas than usual.  Walking can help get rid of the air that was put into your GI tract during the procedure and reduce the bloating. If you had a lower endoscopy (such as a colonoscopy or flexible sigmoidoscopy) you may notice spotting of blood in your stool or on the toilet paper. If you underwent a bowel prep for your procedure, then you may not have a normal bowel movement for a few days.  DIET: Your first meal following the procedure should be a light meal and then it is ok to progress to your normal diet.  A half-sandwich or bowl of soup is an example of a good first meal.  Heavy or fried foods are harder to digest and may make you feel nauseous or bloated.  Likewise meals heavy in dairy and vegetables can cause extra gas to form and this can also increase the bloating.  Drink plenty of fluids but you should avoid alcoholic beverages for 24 hours.  ACTIVITY: Your care partner should take you home directly after the procedure.  You should plan to take it easy, moving slowly for the rest of the day.  You can resume normal activity the day after the procedure however you should NOT DRIVE or use heavy machinery for 24 hours (because of the sedation medicines used during the test).    SYMPTOMS TO REPORT IMMEDIATELY: A gastroenterologist can be reached at any hour.  During normal business hours, 8:30 AM to 5:00 PM Monday through Friday,  call [phone].  After hours and on weekends, please call the GI answering service at [phone] who will take a message and have the physician on call contact you.     Following upper endoscopy (EGD)  Vomiting of blood or coffee ground material  New chest pain or pain under the shoulder blades  Painful or persistently difficult swallowing  New shortness of breath  Fever of 100F or higher  Black, tarry-looking stools  FOLLOW UP: If any biopsies were taken you will be contacted by phone or by letter within the next 1-3 weeks.  Call your gastroenterologist if you have not heard about the biopsies in 3 weeks.  Our staff will call the home number listed on your records the next business day following your procedure to check on you and address any questions or concerns that you may have at that time regarding the information given to you following your procedure. This is a courtesy call and so if there is no answer at the home number and we have not heard from you through the emergency physician on call, we will assume that you have returned to your regular daily activities without incident.  SIGNATURES/CONFIDENTIALITY: You and/or your care partner have signed paperwork which will be entered into your electronic medical record.  These signatures attest to the fact that that the information above on your After Visit Summary has been reviewed and is understood.    Full responsibility of the confidentiality of this discharge information lies with you and/or your care-partner.     Handouts were given to your care partner on barrett's esophagus, hiatal hernia and gastritis. You may resume your current medications today.  Continue taking you acid medication once daily.  It is best to take it 20 - 30 minutes prior to breakfast meal. Await biopsy results. Please call if any questions or concerns.

## 2013-08-18 NOTE — Progress Notes (Signed)
Called to room to assist during endoscopic procedure.  Patient ID and intended procedure confirmed with present staff. Received instructions for my participation in the procedure from the performing physician.  

## 2013-08-19 ENCOUNTER — Telehealth: Payer: Self-pay

## 2013-08-19 NOTE — Telephone Encounter (Signed)
  Follow up Call-  Call back number 08/18/2013 08/08/2012  Post procedure Call Back phone  # [phone]  Permission to leave phone message Yes No     Patient questions:  Do you have a fever, pain , or abdominal swelling? no Pain Score  0 *  Have you tolerated food without any problems? yes  Have you been able to return to your normal activities? yes  Do you have any questions about your discharge instructions: Diet   no Medications  no Follow up visit  no  Do you have questions or concerns about your Care? no  Actions: * If pain score is 4 or above: No action needed, pain <4.

## 2013-08-25 ENCOUNTER — Encounter: Payer: Self-pay | Admitting: Internal Medicine

## 2013-08-26 ENCOUNTER — Telehealth: Payer: Self-pay | Admitting: Internal Medicine

## 2013-08-26 DIAGNOSIS — K2270 Barrett's esophagus without dysplasia: Secondary | ICD-10-CM

## 2013-08-26 MED ORDER — ESOMEPRAZOLE MAGNESIUM 40 MG PO CPDR: 40.0000 mg | DELAYED_RELEASE_CAPSULE | Freq: Two times a day (BID) | ORAL | Status: DC

## 2013-08-26 NOTE — Telephone Encounter (Signed)
Pt was direct colon, discontinued Rx endoscopy center sent to pt's local pharmacy, sent Nexium to [facility]

## 2013-10-09 NOTE — Telephone Encounter (Signed)
Spoke with [facility]-they had made her referral inactive due to a lack of response to their phone calls. I called the patient and advised her. Gave her the phone number. She says she will call.

## 2014-08-31 ENCOUNTER — Other Ambulatory Visit: Payer: Self-pay | Admitting: Neurosurgery

## 2014-08-31 DIAGNOSIS — M545 Low back pain: Secondary | ICD-10-CM

## 2014-08-31 DIAGNOSIS — M4802 Spinal stenosis, cervical region: Secondary | ICD-10-CM

## 2014-08-31 DIAGNOSIS — M546 Pain in thoracic spine: Secondary | ICD-10-CM

## 2014-09-14 ENCOUNTER — Ambulatory Visit
Admission: RE | Admit: 2014-09-14 | Discharge: 2014-09-14 | Disposition: A | Discharge status: Home / Self Care | Payer: BLUE CROSS/BLUE SHIELD | Source: Ambulatory Visit | Attending: Neurosurgery | Admitting: Neurosurgery

## 2014-09-14 VITALS — BP 167/79 | HR 70

## 2014-09-14 DIAGNOSIS — M546 Pain in thoracic spine: Secondary | ICD-10-CM

## 2014-09-14 DIAGNOSIS — M545 Low back pain: Secondary | ICD-10-CM

## 2014-09-14 DIAGNOSIS — M419 Scoliosis, unspecified: Secondary | ICD-10-CM

## 2014-09-14 DIAGNOSIS — M4802 Spinal stenosis, cervical region: Secondary | ICD-10-CM

## 2014-09-14 MED ORDER — MEPERIDINE HCL 100 MG/ML IJ SOLN
75.0000 mg | Freq: Once | INTRAMUSCULAR | Status: AC
  Administered 2014-09-14: 75 mg via INTRAMUSCULAR

## 2014-09-14 MED ORDER — ONDANSETRON HCL 4 MG/2ML IJ SOLN
4.0000 mg | Freq: Once | INTRAMUSCULAR | Status: AC
  Administered 2014-09-14: 4 mg via INTRAMUSCULAR

## 2014-09-14 MED ORDER — DIAZEPAM 5 MG PO TABS
10.0000 mg | ORAL_TABLET | Freq: Once | ORAL | Status: AC
  Administered 2014-09-14: 10 mg via ORAL

## 2014-09-14 MED ORDER — IOHEXOL 300 MG/ML  SOLN
10.0000 mL | Freq: Once | INTRAMUSCULAR | Status: AC | PRN
  Administered 2014-09-14: 10 mL via INTRATHECAL

## 2014-09-14 NOTE — Discharge Instructions (Signed)

## 2014-09-17 ENCOUNTER — Telehealth: Payer: Self-pay

## 2014-09-17 DIAGNOSIS — K2270 Barrett's esophagus without dysplasia: Secondary | ICD-10-CM

## 2014-09-17 MED ORDER — ESOMEPRAZOLE MAGNESIUM 40 MG PO CPDR: 40.0000 mg | DELAYED_RELEASE_CAPSULE | Freq: Two times a day (BID) | ORAL | Status: DC

## 2014-09-17 NOTE — Telephone Encounter (Signed)
Refilled Nexium for 3 months only

## 2014-09-21 ENCOUNTER — Other Ambulatory Visit: Payer: Self-pay

## 2014-09-21 ENCOUNTER — Inpatient Hospital Stay: Admission: RE | Admit: 2014-09-21 | Payer: Self-pay | Source: Ambulatory Visit

## 2014-10-13 ENCOUNTER — Telehealth: Payer: Self-pay

## 2014-10-18 NOTE — Telephone Encounter (Signed)
Patient is having not problems and continuing to take nexium

## 2014-10-18 NOTE — Telephone Encounter (Signed)
Lm on vm regarding [facility] rx request

## 2015-02-18 ENCOUNTER — Telehealth: Payer: Self-pay | Admitting: Internal Medicine

## 2015-02-18 NOTE — Telephone Encounter (Signed)
Pt called to let Dr. Rhea BeltonPyrtle know that she has been diagnosed with cdiff by urgent care. Pt has been placed on Flagyl.

## 2015-03-21 ENCOUNTER — Telehealth: Payer: Self-pay | Admitting: Internal Medicine

## 2015-03-21 NOTE — Telephone Encounter (Signed)
Pt states she was seen in the ER in [place] Friday and told she has cdiff and salmonella. This is a recurrence of cdiff. Pt states she was told to follow-up with GI by the er. She was placed on Flagyl 500mg  BID for 7 days and Bactrim 800-160mg  BID for 7 days. Pt wants to know if she needs to be seen or what else she may need to do. Please advise.

## 2015-03-21 NOTE — Telephone Encounter (Signed)
Would recommend she complete antibiotic therapy and add Florastor 250 mg BID If diarrhea fails to resolve, then she needs to let us know Would recommend ROV for continuity

## 2015-03-22 NOTE — Telephone Encounter (Signed)
Pt aware and states she is taking  florastor. Pt instructed to call back when finished antibiotic if diarrhea has not resolved and we will schedule pt for an OV.

## 2015-03-22 NOTE — Telephone Encounter (Signed)
Left message for pt to call back  °

## 2015-06-06 ENCOUNTER — Other Ambulatory Visit: Payer: Self-pay | Admitting: Internal Medicine

## 2015-06-13 ENCOUNTER — Telehealth: Payer: Self-pay | Admitting: Internal Medicine

## 2015-06-13 NOTE — Telephone Encounter (Signed)
Pt due for 3 year recall which would be May of 2018, pt aware.

## 2015-07-21 ENCOUNTER — Telehealth: Payer: Self-pay | Admitting: Internal Medicine

## 2015-07-21 NOTE — Telephone Encounter (Signed)
Pt states she is still having diarrhea. Does not feel that she is completely over cdiff and salmonella. Pt scheduled to see Amy Esterwood PA 07/28/15@1 :30pm. Pt aware of appt.

## 2015-07-26 ENCOUNTER — Ambulatory Visit: Payer: BLUE CROSS/BLUE SHIELD | Admitting: Physician Assistant

## 2015-07-28 ENCOUNTER — Ambulatory Visit: Payer: BLUE CROSS/BLUE SHIELD | Admitting: Physician Assistant

## 2015-08-11 ENCOUNTER — Encounter: Payer: Self-pay | Admitting: Physician Assistant

## 2015-08-11 ENCOUNTER — Ambulatory Visit (INDEPENDENT_AMBULATORY_CARE_PROVIDER_SITE_OTHER): Payer: BLUE CROSS/BLUE SHIELD | Admitting: Physician Assistant

## 2015-08-11 ENCOUNTER — Other Ambulatory Visit: Payer: BLUE CROSS/BLUE SHIELD

## 2015-08-11 VITALS — BP 120/80 | HR 106 | Ht 68.0 in | Wt 134.0 lb

## 2015-08-11 DIAGNOSIS — R1310 Dysphagia, unspecified: Secondary | ICD-10-CM

## 2015-08-11 DIAGNOSIS — K2270 Barrett's esophagus without dysplasia: Secondary | ICD-10-CM

## 2015-08-11 DIAGNOSIS — Z8619 Personal history of other infectious and parasitic diseases: Secondary | ICD-10-CM

## 2015-08-11 DIAGNOSIS — Z8719 Personal history of other diseases of the digestive system: Secondary | ICD-10-CM

## 2015-08-11 DIAGNOSIS — IMO0001 Reserved for inherently not codable concepts without codable children: Secondary | ICD-10-CM

## 2015-08-11 MED ORDER — FAMOTIDINE 40 MG PO TABS: 40.0000 mg | ORAL_TABLET | Freq: Every day | ORAL | Status: DC

## 2015-08-11 NOTE — Progress Notes (Addendum)
Patient ID: Anne [name], female   DOB: 06/27/1965, 50 y.o.   MRN: [id number]   Subjective:    Patient ID: Anne [name], female    DOB: 11/12/1965, 50 y.o.   MRN: [id number]  HPI Anne [name] is a pleasant 50 year old white female known to Dr. Rhea BeltonPyrtle who has history of Barrett's esophagus and a hiatal hernia. She last had EGD in May 2015. Biopsy showed chronic gastritis negative for H. pylori and intestinal metaplasia consistent with Barrett's esophagus without dysplasia. She has not had colonoscopy. Patient comes in today to discuss 2 episodes of C. difficile colitis and concerns for recurrence. Patient relates that she had an episode of pneumonia in August September 2016. After that she had diarrhea intermittently and then worsening of diarrhea in November and was diagnosed with C. difficile. Was treated through an urgent care with a course of Flagyl and symptoms improved. In mid December she had recurrence of significant diarrhea, went to the emergency room [facility] and was diagnosed with C. difficile and salmonella. She apparently also had a urinary tract infection. She was given a course of antibiotic for the UTI in another course of Flagyl for C. difficile. Her symptoms resolved and she did well until mid April when she developed recurrent malodorous green watery diarrhea she was seen in the [facility] but was unable to produce a stool specimen while she was there also was never retested for C. difficile. She was diagnosed again with urinary tract infection and took a course of Bactrim. She required a longer course with Bactrim DS when symptoms relapse. The diarrhea eventually resolved. She is still occasionally having loose stools and has not had any ongoing diarrhea says this week she is felt the best she has felt. She actually had a formed bowel movement today. She's also concerned about ongoing fatigue and says she just hasn't felt well over the past several months  several months. We have received some of her records [facility] just dealing with her last visit to the ER at which time she was diagnosed with the urinary tract infection. He is and has lots of questions about the C. difficile continent, avoiding recurrence, antibiotic use etc.  Review of Systems Pertinent positive and negative review of systems were noted in the above HPI section.  All other review of systems was otherwise negative.  Outpatient Encounter Prescriptions as of 08/11/2015  Medication Sig  . ALPRAZolam (XANAX) 0.25 MG tablet Take 0.25 mg by mouth as needed.  . diazepam (VALIUM) 5 MG tablet Take 5 mg by mouth every 6 (six) hours as needed for anxiety.  . norethindrone (MICRONOR,CAMILA,ERRIN) 0.35 MG tablet Take 1 tablet by mouth daily.  Marland Kitchen. oxyCODONE-acetaminophen (PERCOCET/ROXICET) 5-325 MG tablet Take by mouth every 6 (six) hours as needed for severe pain.  Marland Kitchen. zolpidem (AMBIEN) 5 MG tablet Take 5 mg by mouth at bedtime as needed.  [name]. CYCLAFEM 1/35 tablet Take 1 tablet by mouth daily.  . famotidine (PEPCID) 40 MG tablet Take 1 tablet (40 mg total) by mouth daily.  . fluticasone (FLONASE) 50 MCG/ACT nasal spray instill 1 spray into each nostril twice a day for RHINITIS (Patient not taking: Reported on 08/11/2015)  . HYDROcodone-acetaminophen (NORCO) 10-325 MG per tablet Take 1 tablet by mouth as needed. Reported on 08/11/2015  . [DISCONTINUED] CARAFATE 1 GM/10ML suspension take 10 milliliters (2 teaspoonfuls) by mouth four times a day (Patient not taking: Reported on 08/11/2015)  . [DISCONTINUED] esomeprazole (NEXIUM) 40 MG  capsule Take 1 capsule (40 mg total) by mouth 2 (two) times daily. (Patient not taking: Reported on 08/11/2015)  . [DISCONTINUED] esomeprazole (NEXIUM) 40 MG capsule Take 1 capsule (40 mg total) by mouth 2 (two) times daily. (Patient not taking: Reported on 08/11/2015)  . [DISCONTINUED] meloxicam (MOBIC) 15 MG tablet take 1/2 to 1 tablet by mouth once daily if  needed (Patient not taking: Reported on 08/11/2015)  . [DISCONTINUED] traMADol (ULTRAM) 50 MG tablet Take 1 tablet (50 mg total) by mouth 2 (two) times daily. (Patient not taking: Reported on 08/11/2015)   No facility-administered encounter medications on file as of 08/11/2015.   Allergies  Allergen Reactions  . Iodinated Diagnostic Agents     13 hour prep   . Erythromycin Nausea And Vomiting       . Moxifloxacin Nausea And Vomiting        Patient Active Problem List   Diagnosis Date Noted  . H/O Clostridium difficile infection 08/11/2015  . Barrett's esophagus 08/18/2013  . Scoliosis of lumbosacral spine 05/04/2013  . Menorrhagia 04/04/2012  . Eustachian tube dysfunction 01/10/2012  . DEPRESSIVE DISORDER 06/01/2009  . CEPHALGIA 08/27/2008   Social History   Social History  . Marital Status: Married    Spouse Name: N/A  . Number of Children: 2  . Years of Education: N/A   Occupational History  . lawyer    Social History Main Topics  . Smoking status: Former Smoker -- 0.25 packs/day for 6 years    Types: Cigarettes    Quit date: 04/03/1987  . Smokeless tobacco: Never Used     Comment: smoked 7 years < 1 ppd  . Alcohol Use: 0.0 oz/week    0 Standard drinks or equivalent per week     Comment: socially,maybe weekly  . Drug Use: No  . Sexual Activity: Yes    Birth Control/ Protection: Pill   Other Topics Concern  . Not on file   Social History Narrative    [name]'s family history includes Cancer in her maternal grandmother and paternal grandmother; Hypertension in her father; Ovarian cancer in her mother; Testicular cancer in her father. There is no history of Colon cancer, Stomach cancer, or Esophageal cancer.      Objective:    Filed Vitals:   08/11/15 1450  BP: 120/80  Pulse: 106    Physical Exam  well-developed white female in no acute distress, pleasant blood pressure 120/80 pulse 106 height 5 foot 8 weight 134 . HEENT ;nontraumatic normocephalic  EOMI PERRLA sclera anicteric, Cardiovascular; regular rate and rhythm with S1-S2 no murmur rub or gallop, Pulmonary; clear bilaterally, Abdomen ;soft, nontender nondistended bowel sounds are active there is no palpable mass or hepatosplenomegaly bowel sounds present, Rectal; exam not done, Ext; no clubbing cyanosis or edema skin warm and dry, Neuropsych ;mood and affect appropriate     Assessment & Plan:   #1 50 yo female With history of relapsing C. difficile colitis, initially diagnosed in November 2016 and then relapsed in December 2016 both treated with metronidazole. Patient did well until mid April 2017 he developed recurrent diarrhea along with urinary tract infection. She has not been retested for C. difficile that her stools are now about back to normal and has had formed bowel movements past couple of days. #2 recent recurrent UTIs #3 Barrett's esophagus-due for follow-up EGD May 2018  Plan; she requests UA and culture today We'll place an order for C. difficile PCR, she'll return specimen if diarrhea recurs Start  Florastor 250 mg one by mouth twice a day Patient asks about connection between PPIs and increase incidence of C. difficile, she had been on chronic PPI because of Barrett's. She has not been taking regularly over the past few months, and really does not have ongoing heartburn or indigestion. Will switch to Pepcid 40 mg by mouth daily at bedtime He had a long discussion regarding the nature of C. difficile, tendency to relapse, association with antibiotics etc. Suggested that she could take a course of metronidazole simultaneously should she require another antibiotic in the near future. She'll follow up with Dr. Rhea Belton or myself as needed.   Adahlia Stembridge S Viktor Philipp PA-C 08/11/2015   Cc: Pecola Lawless, MD   Addendum: Reviewed and agree with management.  Would given Pepcid 20 mg q12h for more continuous acid suppression given Barrett's history Beverley Fiedler, MD

## 2015-08-11 NOTE — Patient Instructions (Addendum)
Please go to the basement level to our lab for the Urine and culture test and the C Diff test.  We sent a prescription to [facility] for the Pepcid 40 mg.  Take the Florastor 250 mg, take 1 tab daily.   We will call you with the lab results.

## 2015-08-12 LAB — URINALYSIS W MICROSCOPIC + REFLEX CULTURE
Bacteria, UA: NONE SEEN [HPF]
Bilirubin Urine: NEGATIVE
CASTS: NONE SEEN [LPF]
CRYSTALS: NONE SEEN [HPF]
Glucose, UA: NEGATIVE
Hgb urine dipstick: NEGATIVE
KETONES UR: NEGATIVE
Leukocytes, UA: NEGATIVE
Nitrite: NEGATIVE
Protein, ur: NEGATIVE
RBC / HPF: NONE SEEN RBC/HPF (ref <=2)
Specific Gravity, Urine: 1.006 (ref 1.001–1.035)
Squamous Epithelial / LPF: NONE SEEN [HPF] (ref <=5)
WBC, UA: NONE SEEN WBC/HPF (ref <=5)
Yeast: NONE SEEN [HPF]
pH: 6 (ref 5.0–8.0)

## 2015-08-17 ENCOUNTER — Ambulatory Visit (INDEPENDENT_AMBULATORY_CARE_PROVIDER_SITE_OTHER): Payer: BLUE CROSS/BLUE SHIELD | Admitting: Family Medicine

## 2015-08-17 ENCOUNTER — Encounter: Payer: Self-pay | Admitting: Family Medicine

## 2015-08-17 VITALS — BP 144/94 | HR 87 | Temp 98.2°F | Ht 68.0 in | Wt 131.8 lb

## 2015-08-17 DIAGNOSIS — F4323 Adjustment disorder with mixed anxiety and depressed mood: Secondary | ICD-10-CM | POA: Diagnosis not present

## 2015-08-17 DIAGNOSIS — G8929 Other chronic pain: Secondary | ICD-10-CM | POA: Diagnosis not present
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
## 2015-08-17 NOTE — Patient Instructions (Signed)
It was nice to meet you today Why don't we plan to check back in 1-2 months to see how you are doing.   I would suspect that a lot of your fatigue is due to being in pain.  You may want to consider doing an epidural steroid injection through your pain doctor I do suspect that you may have some depression- medication may be helpful. Let me know if you are interested in using this  It does sound like doing some counseling might be helpful to you but I understand if you do not want to pursue this at this time   Would going part- time at work be a possibility for you?  This might help take the pressure off your body and your mind!

## 2015-08-17 NOTE — Progress Notes (Signed)
Pre visit review using our clinic tool,if applicable. No additional management support is needed unless otherwise documented below in the visit note.  

## 2015-10-06 ENCOUNTER — Ambulatory Visit (INDEPENDENT_AMBULATORY_CARE_PROVIDER_SITE_OTHER): Payer: BLUE CROSS/BLUE SHIELD | Admitting: Family Medicine

## 2015-10-06 ENCOUNTER — Encounter: Payer: Self-pay | Admitting: Family Medicine

## 2015-10-06 VITALS — BP 121/79 | HR 73 | Temp 98.2°F | Ht 68.0 in | Wt 130.6 lb

## 2015-10-06 DIAGNOSIS — Z130 Encounter for screening for diseases of the blood and blood-forming organs and certain disorders involving the immune mechanism: Secondary | ICD-10-CM | POA: Diagnosis not present

## 2015-10-06 DIAGNOSIS — N912 Amenorrhea, unspecified: Secondary | ICD-10-CM | POA: Diagnosis not present

## 2015-10-06 DIAGNOSIS — L678 Other hair color and hair shaft abnormalities: Secondary | ICD-10-CM

## 2015-10-06 DIAGNOSIS — Z131 Encounter for screening for diabetes mellitus: Secondary | ICD-10-CM

## 2015-10-06 DIAGNOSIS — Z13220 Encounter for screening for lipoid disorders: Secondary | ICD-10-CM

## 2015-10-06 DIAGNOSIS — F4323 Adjustment disorder with mixed anxiety and depressed mood: Secondary | ICD-10-CM

## 2015-10-06 DIAGNOSIS — Z23 Encounter for immunization: Secondary | ICD-10-CM | POA: Diagnosis not present

## 2015-10-06 LAB — LIPID PANEL
Cholesterol: 159 mg/dL (ref 0–200)
HDL: 68.7 mg/dL (ref >39.00)
LDL Cholesterol: 60 mg/dL (ref 0–99)
NonHDL: 90.35
Total CHOL/HDL Ratio: 2
Triglycerides: 150 mg/dL — ABNORMAL HIGH (ref 0.0–149.0)
VLDL: 30 mg/dL (ref 0.0–40.0)

## 2015-10-06 LAB — CBC
HEMATOCRIT: 36.4 % (ref 36.0–46.0)
Hemoglobin: 12.3 g/dL (ref 12.0–15.0)
MCHC: 33.9 g/dL (ref 30.0–36.0)
MCV: 96.5 fl (ref 78.0–100.0)
Platelets: 184 10*3/uL (ref 150.0–400.0)
RBC: 3.77 Mil/uL — AB (ref 3.87–5.11)
RDW: 13.5 % (ref 11.5–15.5)
WBC: 5.9 10*3/uL (ref 4.0–10.5)

## 2015-10-06 LAB — COMPREHENSIVE METABOLIC PANEL
ALK PHOS: 32 U/L — AB (ref 39–117)
ALT: 9 U/L (ref 0–35)
AST: 16 U/L (ref 0–37)
Albumin: 4.1 g/dL (ref 3.5–5.2)
BUN: 11 mg/dL (ref 6–23)
CO2: 23 mEq/L (ref 19–32)
CREATININE: 0.79 mg/dL (ref 0.40–1.20)
Calcium: 8.9 mg/dL (ref 8.4–10.5)
Chloride: 107 mEq/L (ref 96–112)
GFR: 81.61 mL/min (ref >60.00)
Glucose, Bld: 86 mg/dL (ref 70–99)
POTASSIUM: 3.8 meq/L (ref 3.5–5.1)
SODIUM: 136 meq/L (ref 135–145)
TOTAL PROTEIN: 6.3 g/dL (ref 6.0–8.3)
Total Bilirubin: 0.5 mg/dL (ref 0.2–1.2)

## 2015-10-06 LAB — FOLLICLE STIMULATING HORMONE: FSH: 2.3 m[IU]/mL

## 2015-10-06 LAB — TSH: TSH: 1.4 u[IU]/mL (ref 0.35–4.50)

## 2015-10-06 MED ORDER — BUSPIRONE HCL 7.5 MG PO TABS: 7.5000 mg | ORAL_TABLET | Freq: Two times a day (BID) | ORAL | Status: DC
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
## 2015-10-06 NOTE — Progress Notes (Signed)
Pre visit review using our clinic review tool, if applicable. No additional management support is needed unless otherwise documented below in the visit note. 

## 2015-10-06 NOTE — Patient Instructions (Addendum)
We will check your thyroid and FSH today as well as a blood panel and I will be in touch with the results asap You also got a tetanus shot today Take care!  Start on the buspar twice a day and let me know how it does for you; we can increase your dose if needed

## 2015-10-13 ENCOUNTER — Encounter: Payer: Self-pay | Admitting: Family Medicine

## 2015-10-22 ENCOUNTER — Encounter: Payer: Self-pay | Admitting: Physician Assistant

## 2015-10-22 ENCOUNTER — Encounter: Payer: Self-pay | Admitting: Family Medicine

## 2015-10-24 NOTE — Telephone Encounter (Signed)
Anne [name] does not do this type of benefits investigation. She did make the suggestion the patient call the insurance company to get a clear picture of what they are referring to.

## 2015-11-29 ENCOUNTER — Encounter: Payer: Self-pay | Admitting: Family Medicine

## 2015-11-29 ENCOUNTER — Encounter: Payer: Self-pay | Admitting: Physician Assistant

## 2015-11-29 DIAGNOSIS — F4323 Adjustment disorder with mixed anxiety and depressed mood: Secondary | ICD-10-CM

## 2015-11-30 ENCOUNTER — Other Ambulatory Visit: Payer: Self-pay

## 2015-11-30 LAB — COLOGUARD: Cologuard: NEGATIVE

## 2015-11-30 MED ORDER — BUSPIRONE HCL 7.5 MG PO TABS: ORAL_TABLET | ORAL | 2 refills | Status: DC

## 2015-12-13 ENCOUNTER — Encounter: Payer: Self-pay | Admitting: Family Medicine

## 2016-02-08 ENCOUNTER — Ambulatory Visit (INDEPENDENT_AMBULATORY_CARE_PROVIDER_SITE_OTHER): Payer: BLUE CROSS/BLUE SHIELD | Admitting: Physician Assistant

## 2016-02-08 ENCOUNTER — Encounter: Payer: Self-pay | Admitting: Family Medicine

## 2016-02-08 ENCOUNTER — Encounter: Payer: Self-pay | Admitting: Physician Assistant

## 2016-02-08 VITALS — BP 118/78 | HR 65 | Temp 98.2°F | Ht 68.0 in | Wt 130.4 lb

## 2016-02-08 DIAGNOSIS — J029 Acute pharyngitis, unspecified: Secondary | ICD-10-CM | POA: Diagnosis not present

## 2016-02-08 LAB — POCT RAPID STREP A (OFFICE): RAPID STREP A SCREEN: NEGATIVE

## 2016-02-08 MED ORDER — HYDROCODONE-HOMATROPINE 5-1.5 MG/5ML PO SYRP: 5.0000 mL | ORAL_SOLUTION | Freq: Four times a day (QID) | ORAL | 0 refills | Status: DC | PRN

## 2016-02-08 MED ORDER — LIDOCAINE VISCOUS 2 % MT SOLN: 15.0000 mL | OROMUCOSAL | 0 refills | Status: DC | PRN

## 2016-02-08 NOTE — Patient Instructions (Signed)
Your symptoms and exam seem most consistent with a viral pharyngitis and URI. Your rapid strep test is negative. I am sending for a culture to further assess. You have some mild redness in the throat but no pustular exudate or lymphadenopathy on exam. Giving this plus history of C. Diff with antibiotic use, I would not start antibiotic until culture results come in.  I want you to stay well hydrated and get plenty of rest. Continue salt-water gargles and keep a humidifier in the bedroom. Tylenol for throat pain.  I am sending you in a mouthwash that will help numb the throat and ease pain. Use no more than as directed.  I want you to restart your Flonase and start a saline nasal rinse twice daily. I feel the throat pain is exacerbated form post-nasal drip. Consider starting an Echinacea supplement over the counter as some studies show this supplement can help speed recovery from viral upper respiratory infections. Mucinex will help thing out any congestion. Make sure to stay well hydrated while on this medication.  I will call you with results as soon as they are in and we will alter your regimen accordingly.
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
## 2016-02-08 NOTE — Telephone Encounter (Signed)
Please contact pt to schedule an OV today.

## 2016-02-08 NOTE — Progress Notes (Signed)
Pre visit review using our clinic review tool, if applicable. No additional management support is needed unless otherwise documented below in the visit note./hsm  

## 2016-02-08 NOTE — Telephone Encounter (Signed)
Pt called in at 8 am to schedule. Pt has been scheduled.

## 2016-02-09 ENCOUNTER — Encounter: Payer: Self-pay | Admitting: Family Medicine

## 2016-02-09 DIAGNOSIS — F4323 Adjustment disorder with mixed anxiety and depressed mood: Secondary | ICD-10-CM

## 2016-02-09 MED ORDER — BUSPIRONE HCL 7.5 MG PO TABS: ORAL_TABLET | ORAL | 2 refills | Status: DC

## 2016-02-10 ENCOUNTER — Encounter: Payer: Self-pay | Admitting: Family Medicine

## 2016-02-10 LAB — CULTURE, GROUP A STREP: ORGANISM ID, BACTERIA: NORMAL

## 2016-02-11 ENCOUNTER — Encounter: Payer: Self-pay | Admitting: Physician Assistant

## 2016-02-13 ENCOUNTER — Encounter: Payer: Self-pay | Admitting: Physician Assistant

## 2016-02-15 ENCOUNTER — Ambulatory Visit (INDEPENDENT_AMBULATORY_CARE_PROVIDER_SITE_OTHER): Payer: BLUE CROSS/BLUE SHIELD | Admitting: Family Medicine

## 2016-02-15 ENCOUNTER — Encounter: Payer: Self-pay | Admitting: Family Medicine

## 2016-02-15 VITALS — BP 135/83 | HR 72 | Temp 98.0°F | Ht 68.0 in | Wt 126.4 lb

## 2016-02-15 DIAGNOSIS — J029 Acute pharyngitis, unspecified: Secondary | ICD-10-CM | POA: Diagnosis not present

## 2016-02-15 DIAGNOSIS — F4323 Adjustment disorder with mixed anxiety and depressed mood: Secondary | ICD-10-CM | POA: Diagnosis not present
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
## 2016-02-15 NOTE — Progress Notes (Signed)
Pre visit review using our clinic review tool, if applicable. No additional management support is needed unless otherwise documented below in the visit note. 

## 2016-02-15 NOTE — Patient Instructions (Signed)
It was good to see you today- it looks like you are on the right track. I agree that you do not need antibioitcs at this time, but the steroid treatment you are using may be helpful.   If your sore throat comes back treat the pain with tylenol, and you might also try an OTC allergy medication such as claritin. If you have a fever please let me know and I hope that you continue to improve. Let me know if you have other concerns

## 2016-04-25 ENCOUNTER — Encounter: Payer: Self-pay | Admitting: Family Medicine

## 2016-04-25 DIAGNOSIS — M961 Postlaminectomy syndrome, not elsewhere classified: Secondary | ICD-10-CM | POA: Insufficient documentation

## 2016-05-07 ENCOUNTER — Other Ambulatory Visit: Payer: Self-pay | Admitting: Family Medicine

## 2016-05-07 DIAGNOSIS — F4323 Adjustment disorder with mixed anxiety and depressed mood: Secondary | ICD-10-CM

## 2016-05-09 ENCOUNTER — Ambulatory Visit (INDEPENDENT_AMBULATORY_CARE_PROVIDER_SITE_OTHER): Payer: BLUE CROSS/BLUE SHIELD | Admitting: Family Medicine

## 2016-05-09 ENCOUNTER — Encounter: Payer: Self-pay | Admitting: Family Medicine

## 2016-05-09 VITALS — BP 136/76 | HR 67 | Temp 97.9°F | Resp 16 | Ht 68.0 in | Wt 121.8 lb

## 2016-05-09 DIAGNOSIS — F4323 Adjustment disorder with mixed anxiety and depressed mood: Secondary | ICD-10-CM

## 2016-05-09 DIAGNOSIS — R079 Chest pain, unspecified: Secondary | ICD-10-CM | POA: Diagnosis not present

## 2016-05-09 DIAGNOSIS — F410 Panic disorder [episodic paroxysmal anxiety] without agoraphobia: Secondary | ICD-10-CM | POA: Diagnosis not present

## 2016-05-09 DIAGNOSIS — G894 Chronic pain syndrome: Secondary | ICD-10-CM | POA: Diagnosis not present
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
## 2016-05-09 NOTE — Progress Notes (Signed)
Pre visit review using our clinic review tool, if applicable. No additional management support is needed unless otherwise documented below in the visit note. 

## 2016-05-09 NOTE — Patient Instructions (Addendum)
Please do NOT take more than 10 mg of ambien at night- this is dangerous to do.   I would strongly encourage you to see a counselor, and if you are willing a psychiatrist.  Please think hard about this- I think it might help you  We will have you work a reduced schedule for the next couple of months to give yourself a break.   You can continue to take xanax twice a day as needed- however please remember that this medication can be habit forming and sedating. It should NOT be combined with any other sedating medication

## 2016-05-24 ENCOUNTER — Encounter: Payer: Self-pay | Admitting: Family Medicine

## 2016-05-30 ENCOUNTER — Encounter: Payer: Self-pay | Admitting: Physician Assistant

## 2016-06-04 ENCOUNTER — Other Ambulatory Visit: Payer: Self-pay | Admitting: Family Medicine

## 2016-06-04 DIAGNOSIS — F4323 Adjustment disorder with mixed anxiety and depressed mood: Secondary | ICD-10-CM

## 2016-06-04 NOTE — Telephone Encounter (Signed)
Received refill request for BUSPIRONE 7.5MG  TABLETS. Last office visit

## 2016-06-13 ENCOUNTER — Ambulatory Visit (INDEPENDENT_AMBULATORY_CARE_PROVIDER_SITE_OTHER)
Admission: RE | Admit: 2016-06-13 | Discharge: 2016-06-13 | Disposition: A | Discharge status: Home / Self Care | Payer: BLUE CROSS/BLUE SHIELD | Source: Ambulatory Visit | Attending: Physician Assistant | Admitting: Physician Assistant

## 2016-06-13 ENCOUNTER — Other Ambulatory Visit (INDEPENDENT_AMBULATORY_CARE_PROVIDER_SITE_OTHER): Payer: BLUE CROSS/BLUE SHIELD

## 2016-06-13 ENCOUNTER — Ambulatory Visit (INDEPENDENT_AMBULATORY_CARE_PROVIDER_SITE_OTHER): Payer: BLUE CROSS/BLUE SHIELD | Admitting: Physician Assistant

## 2016-06-13 ENCOUNTER — Encounter: Payer: Self-pay | Admitting: Physician Assistant

## 2016-06-13 VITALS — BP 110/68 | HR 80 | Ht 66.75 in | Wt 121.4 lb

## 2016-06-13 DIAGNOSIS — K219 Gastro-esophageal reflux disease without esophagitis: Secondary | ICD-10-CM | POA: Diagnosis not present

## 2016-06-13 DIAGNOSIS — R0789 Other chest pain: Secondary | ICD-10-CM | POA: Diagnosis not present

## 2016-06-13 DIAGNOSIS — R5383 Other fatigue: Secondary | ICD-10-CM | POA: Diagnosis not present

## 2016-06-13 DIAGNOSIS — K2270 Barrett's esophagus without dysplasia: Secondary | ICD-10-CM

## 2016-06-13 DIAGNOSIS — F458 Other somatoform disorders: Secondary | ICD-10-CM

## 2016-06-13 DIAGNOSIS — R09A2 Foreign body sensation, throat: Secondary | ICD-10-CM

## 2016-06-13 DIAGNOSIS — R0989 Other specified symptoms and signs involving the circulatory and respiratory systems: Secondary | ICD-10-CM

## 2016-06-13 LAB — CBC WITH DIFFERENTIAL/PLATELET
Basophils Absolute: 0.1 10*3/uL (ref 0.0–0.1)
Basophils Relative: 0.9 % (ref 0.0–3.0)
EOS ABS: 0 10*3/uL (ref 0.0–0.7)
Eosinophils Relative: 0.4 % (ref 0.0–5.0)
HCT: 40.5 % (ref 36.0–46.0)
HEMOGLOBIN: 13.7 g/dL (ref 12.0–15.0)
Lymphocytes Relative: 26.6 % (ref 12.0–46.0)
Lymphs Abs: 1.7 10*3/uL (ref 0.7–4.0)
MCHC: 33.8 g/dL (ref 30.0–36.0)
MCV: 96.6 fl (ref 78.0–100.0)
MONO ABS: 0.3 10*3/uL (ref 0.1–1.0)
Monocytes Relative: 4.9 % (ref 3.0–12.0)
Neutro Abs: 4.3 10*3/uL (ref 1.4–7.7)
Neutrophils Relative %: 67.2 % (ref 43.0–77.0)
Platelets: 187 10*3/uL (ref 150.0–400.0)
RBC: 4.19 Mil/uL (ref 3.87–5.11)
RDW: 13.4 % (ref 11.5–15.5)
WBC: 6.4 10*3/uL (ref 4.0–10.5)

## 2016-06-13 LAB — COMPREHENSIVE METABOLIC PANEL
ALBUMIN: 4.4 g/dL (ref 3.5–5.2)
ALK PHOS: 32 U/L — AB (ref 39–117)
ALT: 12 U/L (ref 0–35)
AST: 15 U/L (ref 0–37)
BUN: 10 mg/dL (ref 6–23)
CO2: 27 mEq/L (ref 19–32)
CREATININE: 0.81 mg/dL (ref 0.40–1.20)
Calcium: 9.4 mg/dL (ref 8.4–10.5)
Chloride: 106 mEq/L (ref 96–112)
GFR: 79.07 mL/min (ref >60.00)
Glucose, Bld: 125 mg/dL — ABNORMAL HIGH (ref 70–99)
Potassium: 3.6 mEq/L (ref 3.5–5.1)
SODIUM: 142 meq/L (ref 135–145)
TOTAL PROTEIN: 6.8 g/dL (ref 6.0–8.3)
Total Bilirubin: 0.4 mg/dL (ref 0.2–1.2)

## 2016-06-13 LAB — SEDIMENTATION RATE: Sed Rate: 1 mm/hr (ref 0–30)

## 2016-06-13 LAB — TSH: TSH: 1.14 u[IU]/mL (ref 0.35–4.50)

## 2016-06-13 MED ORDER — HYOSCYAMINE SULFATE 0.125 MG SL SUBL: 0.1250 mg | SUBLINGUAL_TABLET | Freq: Four times a day (QID) | SUBLINGUAL | 2 refills | Status: DC | PRN

## 2016-06-13 MED ORDER — DEXLANSOPRAZOLE 60 MG PO CPDR: 60.0000 mg | DELAYED_RELEASE_CAPSULE | Freq: Every day | ORAL | 6 refills | Status: DC

## 2016-06-13 NOTE — Progress Notes (Addendum)
Subjective:    Patient ID: Anne [name], female    DOB: 08/17/1965, 51 y.o.   MRN: [id number]  HPI Anne CravenKristian  is a pleasant 51 year old white female known to Dr. Rhea [name] and myself with history of chronic GERD, Barrett's esophagus, chronic gastritis, and history of recurrent C. difficile colitis. She was last seen in our office about a year ago. Last EGD was done in May 2015 showing a short segment of Barrett's esophagus and 2 cm hiatal hernia. Biopsy showed persistent Barrett's without dysplasia. She is also had issues with atypical chest pain. She comes in today stating that she has been having horrible problems over the past 6 or 7 weeks with what she believes has been refractory reflux symptoms. She had for a period of time been able to come off of PPI therapy and had been using Pepcid. She says she stopped taking the Pepcid at some point for 5 months ago. She does over the past 6-7 weeks she has been having ongoing daily heartburn and discomfort in her chest. She says she's been having to sleep sitting up and often by the end of the day says that her voice gets very weak. She says she's been extremely fatigued. She complains of an ongoing sensation of "fullness" in her throat and also has some intermittent dysphagia. Since symptoms have been better over the past several weeks she started herself on Nexium twice daily which was an old prescription but says that her symptoms have not resolved. She does have history of anxiety and is on BuSpar and also uses anxiety but does not feel that the fullness sensation in her throat is secondary to anxiety. She says she has been feeling terrible, and has been fatigued with walking any distance and sometimes feels short of breath. She had seen her PCP and had recent 8 EKG done which was negative. She states that she has a court case to try next week does not feel that she can do it. She denies any increase in her baseline stress level etc. She is not  having exertionally induced chest pain but rather just discomfort throughout the day.  Review of Systems Pertinent positive and negative review of systems were noted in the above HPI section.  All other review of systems was otherwise negative.  Outpatient Encounter Prescriptions as of 06/13/2016  Medication Sig  . ALPRAZolam (XANAX) 0.5 MG tablet Take 0.5 mg by mouth as needed.  . busPIRone (BUSPAR) 7.5 MG tablet TAKE 1 TO 2 TABLETS BY MOUTH TWICE DAILY AS NEEDED FOR ANXIETY  . CYCLAFEM 1/35 tablet Take 1 tablet by mouth daily.  Marland Kitchen. esomeprazole (NEXIUM) 40 MG capsule Take 40 mg by mouth 2 (two) times daily.  Marland Kitchen. estradiol (ESTRACE) 0.1 MG/GM vaginal cream I 1 GRAM VAGINALLY 2 TIMES A WK  . famotidine (PEPCID) 40 MG tablet Take 1 tablet (40 mg total) by mouth daily.  Marland Kitchen. HYDROcodone-acetaminophen (NORCO) 10-325 MG tablet Take 1 tablet by mouth daily.  . pentosan polysulfate (ELMIRON) 100 MG capsule Take 100 mg by mouth 3 (three) times daily.  Marland Kitchen. saccharomyces boulardii (FLORASTOR) 250 MG capsule Take 250 mg by mouth 2 (two) times daily.  Marland Kitchen. zolpidem (AMBIEN) 10 MG tablet Take 1 tablet by mouth daily.  Marland Kitchen. dexlansoprazole (DEXILANT) 60 MG capsule Take 1 capsule (60 mg total) by mouth daily before breakfast.  . flavoxATE (URISPAS) 100 MG tablet TK 1 T PO TID PRN  . hyoscyamine (LEVSIN SL) 0.125 MG SL tablet Place 1 tablet (  0.125 mg total) under the tongue every 6 (six) hours as needed.   No facility-administered encounter medications on file as of 06/13/2016.    Allergies  Allergen Reactions  . Erythromycin Nausea And Vomiting       . Iodinated Diagnostic Agents     13 hour prep   . Moxifloxacin Nausea And Vomiting        Patient Active Problem List   Diagnosis Date Noted  . Cervical post-laminectomy syndrome 04/25/2016  . H/O Clostridium difficile infection 08/11/2015  . Barrett's esophagus 08/18/2013  . Scoliosis of lumbosacral spine 05/04/2013  . Menorrhagia 04/04/2012  . Eustachian tube  dysfunction 01/10/2012  . DEPRESSIVE DISORDER 06/01/2009  . CEPHALGIA 08/27/2008   Social History   Social History  . Marital status: Married    Spouse name: N/A  . Number of children: 2  . Years of education: N/A   Occupational History  . lawyer [employer]   Social History Main Topics  . Smoking status: Former Smoker    Packs/day: 0.25    Years: 6.00    Types: Cigarettes    Quit date: 04/03/1987  . Smokeless tobacco: Never Used     Comment: smoked 7 years < 1 ppd  . Alcohol use 0.0 oz/week     Comment: socially,maybe weekly  . Drug use: No  . Sexual activity: Yes    Birth control/ protection: Pill   Other Topics Concern  . Not on file   Social History Narrative  . No narrative on file    Anne [name]'s family history includes Cancer in her maternal grandmother and paternal grandmother; Hypertension in her father; Ovarian cancer in her mother; Testicular cancer in her father.      Objective:    Vitals:   06/13/16 1421  BP: 110/68  Pulse: 80    Physical Exam   well-developed thin white female in no acute distress, pleasant blood pressure 110/68, pulse 80, height 5 foot 6, weight 121, BMI 19.1. HEENT; nontraumatic normocephalic EOMI PERRLA sclera anicteric, Cardiovascular ;regular rate and rhythm with S1-S2 no murmur or gallop, Pulmonary; clear bilaterally, Abdomen ;soft nontender nondistended bowel sounds are active there is no palpable mass or hepatosplenomegaly, Rectal; exam not done,Ext;no clubbing cyanosis or edema skin warm and dry, Neuropsych; mood and affect appropriate       Assessment & Plan:   #71 51 year old female with history of chronic GERD and short segment Barrett's who presents with 6-7 week history of refractory heartburn, chest pain, changes in voice quality, fatigue and some dyspnea with exertion. Most of her symptoms may be explained by GERD and possible esophagitis. She has had atypical chest pain in the past and also will need to rule out  underlying motility disorder-esophageal spasm/nutcracker esophagus #2 globus #3 history of recurrent C. difficile colitis #4  post cervical laminectomy syndrome #5 history of anxiety  Plan; Will switch to Dexilant  60 mg by mouth every morning short-term Strict antireflux regimen including nothing by mouth for 3 hours prior to bed and elevation of head of bed 45 Patient will be due for follow-up EGD in May 2018. Will schedule for EGD with possible esophageal dilation with Dr. Rhea Belton. Procedure discussed in detail with patient including risks and benefits and she is agreeable to proceed Trial of Levsin sublingual every 6 hours when necessary for chest pain/globus sensation Consider esophageal manometry-will hold on scheduling until after EGD Note given for patient to be out of work next week Chest x-ray PA and  lateral today CBC with differential,CMET, TSH, sedimentation rate   Thaily Hackworth S Lajean Boese PA-C 06/13/2016   Cc: Copland, Gwenlyn Found, MD   Addendum: Reviewed and agree with initial management. Beverley Fiedler, MD

## 2016-06-13 NOTE — Patient Instructions (Signed)
We have sent the following medications to your pharmacy for you to pick up at your convenience: Dexilant, Levsin   Your physician has requested that you go to the basement for lab work before leaving today.   Please go to the basement and go by the x-ray department to get a chest x-ray.   You have been scheduled for an endoscopy. Please follow written instructions given to you at your visit today. If you use inhalers (even only as needed), please bring them with you on the day of your procedure. Your physician has requested that you go to www.startemmi.com and enter the access code given to you at your visit today. This web site gives a general overview about your procedure. However, you should still follow specific instructions given to you by our office regarding your preparation for the procedure.   We are providing you with a work note to be excused next week.    I appreciate the opportunity to care for you.

## 2016-06-14 ENCOUNTER — Encounter: Payer: Self-pay | Admitting: Internal Medicine

## 2016-06-17 ENCOUNTER — Encounter: Payer: Self-pay | Admitting: Physician Assistant

## 2016-06-19 ENCOUNTER — Telehealth: Payer: Self-pay | Admitting: Physician Assistant

## 2016-06-19 ENCOUNTER — Encounter: Payer: Self-pay | Admitting: Family Medicine

## 2016-06-19 ENCOUNTER — Other Ambulatory Visit: Payer: Self-pay | Admitting: Urology

## 2016-06-19 NOTE — Telephone Encounter (Signed)
Patient is scheduled for the first available with [facility] which is May 2nd with Dr Vassie LollAlva.  She wants sooner. She says she cannot work because of this problem and that she has jury duty that week. Please advise.

## 2016-06-19 NOTE — Telephone Encounter (Signed)
I would go ahead and schedule with one of the MD's -first available- , and if that is a few weeks out then she could see her primary in the interim

## 2016-06-19 NOTE — Telephone Encounter (Signed)
I sent a note with her CXR to you or Anne [name] to go ahead and get her an appt with [facility], please for COPD and c/o dyspnea. Please schedule her an appt

## 2016-06-19 NOTE — Telephone Encounter (Signed)
[facility] does not book new patient's with the extenders. They do not have a cancellation list where they call the patient. Should we send her to her PCP to decide on treatment for the suggestive CXR?

## 2016-06-19 NOTE — Telephone Encounter (Signed)
Patient scheduled for tomorrow with [facility]. They had a cancellation and it was offered to her.

## 2016-06-20 ENCOUNTER — Encounter: Payer: Self-pay | Admitting: Pulmonary Disease

## 2016-06-20 ENCOUNTER — Ambulatory Visit (INDEPENDENT_AMBULATORY_CARE_PROVIDER_SITE_OTHER): Payer: BLUE CROSS/BLUE SHIELD | Admitting: Pulmonary Disease

## 2016-06-20 VITALS — BP 118/72 | HR 68 | Ht 67.0 in | Wt 120.2 lb

## 2016-06-20 DIAGNOSIS — R0602 Shortness of breath: Secondary | ICD-10-CM | POA: Diagnosis not present

## 2016-06-20 LAB — NITRIC OXIDE: Nitric Oxide: 7

## 2016-06-20 MED ORDER — ALBUTEROL SULFATE HFA 108 (90 BASE) MCG/ACT IN AERS: 2.0000 | INHALATION_SPRAY | Freq: Four times a day (QID) | RESPIRATORY_TRACT | 5 refills | Status: DC | PRN

## 2016-06-20 NOTE — Addendum Note (Signed)
Addended by: Maxwell MarionBLANKENSHIP, Harmon Bommarito A on: 06/20/2016 03:01 PM   Modules accepted: Orders
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
## 2016-06-20 NOTE — Patient Instructions (Signed)
Will schedule for pulmonary function tests Will give an albuterol rescue inhaler to be used as needed  Follow-up in one month.

## 2016-06-20 NOTE — Addendum Note (Signed)
Addended by: Sheran LuzEAST, Abednego Yeates K on: 06/20/2016 03:42 PM   Modules accepted: Orders

## 2016-06-21 ENCOUNTER — Ambulatory Visit (AMBULATORY_SURGERY_CENTER): Payer: BLUE CROSS/BLUE SHIELD | Admitting: Internal Medicine

## 2016-06-21 ENCOUNTER — Encounter: Payer: Self-pay | Admitting: Internal Medicine

## 2016-06-21 VITALS — BP 125/72 | HR 67 | Temp 98.7°F | Resp 18 | Ht 66.0 in | Wt 121.0 lb

## 2016-06-21 DIAGNOSIS — K208 Other esophagitis: Secondary | ICD-10-CM | POA: Diagnosis not present

## 2016-06-21 DIAGNOSIS — K2270 Barrett's esophagus without dysplasia: Secondary | ICD-10-CM

## 2016-06-21 DIAGNOSIS — K219 Gastro-esophageal reflux disease without esophagitis: Secondary | ICD-10-CM

## 2016-06-21 MED ORDER — SODIUM CHLORIDE 0.9 % IV SOLN: 500.0000 mL | INTRAVENOUS | Status: DC

## 2016-06-21 NOTE — Progress Notes (Signed)
Pt's states no medical or surgical changes since previsit or office visit. 

## 2016-06-21 NOTE — Op Note (Signed)
[facility] Patient Name: Anne MallickKristian Poteat Procedure Date: 06/21/2016 3:42 PM MRN: [id number] Endoscopist: Beverley FiedlerJay M Pyrtle , MD Age: 51 Referring MD:  Date of Birth: 11/07/1965 Gender: Female Account #: [id number] Procedure:                Upper GI endoscopy Indications:              Gastro-esophageal reflux disease, Follow-up of                            Barrett's esophagus Medicines:                Monitored Anesthesia Care Procedure:                Pre-Anesthesia Assessment:                           - Prior to the procedure, a History and Physical                            was performed, and patient medications and                            allergies were reviewed. The patient's tolerance of                            previous anesthesia was also reviewed. The risks                            and benefits of the procedure and the sedation                            options and risks were discussed with the patient.                            All questions were answered, and informed consent                            was obtained. Prior Anticoagulants: The patient has                            taken no previous anticoagulant or antiplatelet                            agents. ASA Grade Assessment: II - A patient with                            mild systemic disease. After reviewing the risks                            and benefits, the patient was deemed in                            satisfactory condition to undergo the procedure.  After obtaining informed consent, the endoscope was                            passed under direct vision. Throughout the                            procedure, the patient's blood pressure, pulse, and                            oxygen saturations were monitored continuously. The                            Endoscope was introduced through the mouth, and                            advanced to the second part of duodenum.  The upper                            GI endoscopy was accomplished without difficulty.                            The patient tolerated the procedure well. Scope In: Scope Out: Findings:                 The esophagus and gastroesophageal junction were                            examined with white light and narrow band imaging                            (NBI) from a forward view and retroflexed position.                            There were esophageal mucosal changes suspicious                            for short-segment Barrett's esophagus. These                            changes involved the mucosa at the upper extent of                            the gastric folds (40 cm from the incisors)                            extending to the Z-line (39 cm from the incisors).                            No visible abnormalities were present. The maximum                            longitudinal extent of these esophageal mucosal  changes was 1 cm in length. Mucosa was biopsied                            with a cold forceps for histology in a targeted                            manner in the lower third of the esophagus. One                            specimen bottle was sent to pathology.                           The entire examined stomach was normal.                           The examined duodenum was normal. Complications:            No immediate complications. Estimated Blood Loss:     Estimated blood loss was minimal. Impression:               - Esophageal mucosal changes suspicious for                            short-segment Barrett's esophagus. Biopsied.                           - Normal stomach.                           - Normal examined duodenum. Recommendation:           - Patient has a contact number available for                            emergencies. The signs and symptoms of potential                            delayed complications were discussed  with the                            patient. Return to normal activities tomorrow.                            Written discharge instructions were provided to the                            patient.                           - Resume previous diet.                           - Continue present medications including recently                            started Dexilant 60 mg once daily.                           -  Await pathology results.                           - Repeat upper endoscopy for surveillance based on                            pathology results. Beverley Fiedler, MD 06/21/2016 4:01:53 PM This report has been signed electronically.

## 2016-06-21 NOTE — Progress Notes (Signed)
Called to room to assist during endoscopic procedure.  Patient ID and intended procedure confirmed with present staff. Received instructions for my participation in the procedure from the performing physician.  

## 2016-06-21 NOTE — Progress Notes (Signed)
A and O x3. Report to RN. Tolerated MAC anesthesia well.

## 2016-06-21 NOTE — Patient Instructions (Signed)
YOU HAD AN ENDOSCOPIC PROCEDURE TODAY: Refer to the procedure report and other information in the discharge instructions given to you for any specific questions about what was found during the examination. If this information does not answer your questions, please call [place] office at [phone] to clarify.   YOU SHOULD EXPECT: Some feelings of bloating in the abdomen. Passage of more gas than usual. Walking can help get rid of the air that was put into your GI tract during the procedure and reduce the bloating. If you had a lower endoscopy (such as a colonoscopy or flexible sigmoidoscopy) you may notice spotting of blood in your stool or on the toilet paper. Some abdominal soreness may be present for a day or two, also.  DIET: Your first meal following the procedure should be a light meal and then it is ok to progress to your normal diet. A half-sandwich or bowl of soup is an example of a good first meal. Heavy or fried foods are harder to digest and may make you feel nauseous or bloated. Drink plenty of fluids but you should avoid alcoholic beverages for 24 hours. If you had a esophageal dilation, please see attached instructions for diet.    ACTIVITY: Your care partner should take you home directly after the procedure. You should plan to take it easy, moving slowly for the rest of the day. You can resume normal activity the day after the procedure however YOU SHOULD NOT DRIVE, use power tools, machinery or perform tasks that involve climbing or major physical exertion for 24 hours (because of the sedation medicines used during the test).   SYMPTOMS TO REPORT IMMEDIATELY: A gastroenterologist can be reached at any hour. Please call [phone]  for any of the following symptoms:   Following upper endoscopy (EGD, EUS, ERCP, esophageal dilation) Vomiting of blood or coffee ground material  New, significant abdominal pain  New, significant chest pain or pain under the shoulder blades  Painful or  persistently difficult swallowing  New shortness of breath  Black, tarry-looking or red, bloody stools  FOLLOW UP:  If any biopsies were taken you will be contacted by phone or by letter within the next 1-3 weeks. Call [phone]  if you have not heard about the biopsies in 3 weeks.  Please also call with any specific questions about appointments or follow up tests.  

## 2016-06-22 ENCOUNTER — Telehealth: Payer: Self-pay | Admitting: *Deleted

## 2016-06-22 NOTE — Telephone Encounter (Signed)
  Follow up Call-  Call back number 06/21/2016  Post procedure Call Back phone  # [phone]  Permission to leave phone message Yes  Some recent data might be hidden     Patient questions:  Do you have a fever, pain , or abdominal swelling? No. Pain Score  0 *  Have you tolerated food without any problems? Yes.    Have you been able to return to your normal activities? Yes.    Do you have any questions about your discharge instructions: Diet   No. Medications  No. Follow up visit  No.  Do you have questions or concerns about your Care? No.  Actions: * If pain score is 4 or above: No action needed, pain <4.

## 2016-06-25 ENCOUNTER — Encounter (HOSPITAL_BASED_OUTPATIENT_CLINIC_OR_DEPARTMENT_OTHER): Payer: Self-pay | Admitting: *Deleted

## 2016-06-25 NOTE — Progress Notes (Signed)
NPO AFTER MN.  ARRIVE AT 1015.  NEEDS HG.  WILL TAKE DEXILANT AND NORCO AM DOS W/ SIPS OF WATER.

## 2016-06-28 ENCOUNTER — Encounter: Payer: Self-pay | Admitting: Internal Medicine

## 2016-07-01 NOTE — H&P (Signed)
Office Visit Report     06/15/2016   --------------------------------------------------------------------------------   Anne [name]  MRN: [id number]  PRIMARY CARE:  [facility]  DOB: [birth year], 51 year old Female  REFERRING:  Tawanna Cooler Meisinger    PROVIDER:  Jethro Bolus, M.D.    LOCATION:  [facility] [phone]   --------------------------------------------------------------------------------   CC: I have pain in the bladder.  HPI: Anne [name] is a 51 year-old female patient who was referred by Lavina Hamman who is here for bladder pain.  The patient states the nature of her problem(s) is frequency and pain. Her symptoms have been present for 2 months. She does have urinary urgency. She denies daytime urinary leakage.   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 Evalyn Casco, referred by Dr. Jackelyn Knife for continued evaluation and Rx of Interstitial cystitis. .  In 2008, she was seen for gross hematuria, with CT showing no abnormalities. She ha had a remote hx of tobascco use ( 5 yrs x ? # pks); and cysto was attempted by Dr. Earlene Plater.( pt traumatized)  She has been started on Elmiron  TID, which has helped her somewhat. Her puff score today equals 24, with dyspareunia, urgency, vaginal and pelvic pain. She has had multiple negative urinary cultures.     ALLERGIES: Avelox TABS - Vomiting Erythromycin Derivatives - Vomiting Iodine External Tincture    MEDICATIONS: Elmiron 100 mg capsule  Alprazolam 0.5 mg tablet  Buspirone Hcl 7.5 mg tablet  Cyclafem 1 mg-35 mcg tablet  Dicyclomine Hcl 20 mg tablet  Flavoxate Hcl  Hydrocodone-Acetaminophen  Oxycodone Hcl 10 mg tablet     GU PSH: D&C Non-OB Endometrial Ablation    NON-GU PSH: Back Surgery (Unspecified) Breast augmentation Laser Surgery Cervix    GU PMH: Personal Hx urinary calculi, Nephrolithiasis - 2014      PMH Notes:  1898-04-02 00:00:00 -  Note: Normal Routine History And Physical Adult   NON-GU PMH: Anxiety GERD Other insomnia Personal history of other diseases of the musculoskeletal system and connective tissue Scoliosis, unspecified    FAMILY HISTORY: Cancer - Mother Deceased - Mother   SOCIAL HISTORY: Marital Status: Married Current Smoking Status: Patient does not smoke anymore. Has not smoked since 06/01/1986. Smoked for 5 years.   Tobacco Use Assessment Completed: Used Tobacco in last 30 days? Light Drinker.  Does not drink caffeine. Patient's occupation Sales promotion account executive.     Notes: 2 daughters, son age 11.    REVIEW OF SYSTEMS:    GU Review Female:   Patient reports frequent urination and get up at night to urinate. Patient denies hard to postpone urination, burning /pain with urination, leakage of urine, stream starts and stops, trouble starting your stream, have to strain to urinate, and currently pregnant.  Gastrointestinal (Upper):   Patient reports indigestion/ heartburn. Patient denies nausea and vomiting.  Gastrointestinal (Lower):   Patient denies diarrhea and constipation.  Constitutional:   Patient reports weight loss and fatigue. Patient denies fever and night sweats.  Skin:   Patient denies skin rash/ lesion and itching.  Eyes:   Patient denies blurred vision and double vision.  Ears/ Nose/ Throat:   Patient denies sore throat and sinus problems.  Hematologic/Lymphatic:   Patient reports easy bruising. Patient denies swollen glands.  Cardiovascular:   Patient reports chest pains. Patient denies leg swelling.  Respiratory:   Patient reports shortness of breath. Patient denies cough.  Endocrine:   Patient denies excessive thirst.  Musculoskeletal:   non-fusion. Patient reports back pain. Patient denies joint pain.  Neurological:   Patient denies headaches and dizziness.  Psychologic:   Patient reports anxiety. Patient denies depression.   Notes: non-fusion of cervical spine fusion  Scoliosis    VITAL SIGNS:      06/15/2016 01:13 PM  Weight 121 lb / 54.88 kg  Height 67 in / 170.18 cm  BP 146/81 mmHg  Pulse 71 /min  BMI 18.9 kg/m   GU PHYSICAL EXAMINATION:    Vagina: Pt refuses vaginal exam.    MULTI-SYSTEM PHYSICAL EXAMINATION:    Constitutional: Thin. No physical deformities. Normally developed. Good grooming.   Neck: Neck symmetrical, not swollen. Normal tracheal position.  Respiratory: No labored breathing, no use of accessory muscles.   Cardiovascular: Normal temperature, normal extremity pulses, no swelling, no varicosities.  Lymphatic: No enlargement of neck, axillae, groin.  Skin: No paleness, no jaundice, no cyanosis. No lesion, no ulcer, no rash.  Neurologic / Psychiatric: Patient anxious. Oriented to time, oriented to place, oriented to person. No depression, no agitation.   Gastrointestinal: No mass, no tenderness, no rigidity, non obese abdomen.  Eyes: Normal conjunctivae. Normal eyelids.  Ears, Nose, Mouth, and Throat: Left ear no scars, no lesions, no masses. Right ear no scars, no lesions, no masses. Nose no scars, no lesions, no masses. Normal hearing. Normal lips.  Musculoskeletal: Normal gait and station of head and neck.     PAST DATA REVIEWED:  Source Of History:  Patient  Records Review:   Previous Doctor Records, Previous Patient Records   06/15/16 11/18/06  Urinalysis  Urine Appearance Clear    Urine Color Yellow    Urine Glucose Neg    Urine Bilirubin Neg    Urine Ketones Neg    Urine Specific Gravity 1.015    Urine Blood Neg    Urine pH 7.5    Urine Protein Neg    Urine Urobilinogen 0.2    Urine Nitrites Neg    Urine Leukocyte Esterase Neg  25/UL    PROCEDURES:          Urinalysis Dipstick Dipstick Cont'd  Color: Yellow Bilirubin: Neg  Appearance: Clear Ketones: Neg  Specific Gravity: 1.015 Blood: Neg  pH: 7.5 Protein: Neg  Glucose: Neg Urobilinogen: 0.2    Nitrites: Neg    Leukocyte Esterase: Neg    ASSESSMENT:       ICD-10 Details  1 GU:   Interstitial Cystitis, chronic w/o hematuria - N30.10   2 NON-GU:   Anxiety - F41.9   3   Personal history of other diseases of the musculoskeletal system and connective tissue - Z87.39   4   Scoliosis, unspecified - M41.9   5   Thoracogenic scoliosis, 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 She has significant dyspaurnea, but refuses pelvic exam today to evaluate for levator plate spasm. .  She believes she already knows all about the IC diet and the IC association. She will need cysto, and HOD, and we have discussed that.  She may need urodynamics, and, in the end, may need therapies that avoid medications b/c of her intense workload. I have tried to refer her to the pelvic floor PT , but she will only go t a PT if they are in [place].  I have explained to her that I understand her pain, and that she wil need to have a plan based on as little medication as possible-b/c of her job. In addition, she is noting that her hair is falling out-probably from the Elmiron. Marland Kitchen    PLAN:           Schedule Return Visit/Planned Activity: Next Available Appointment - Schedule Surgery, Follow up MD          Document Letter(s):  Created for Patient: Clinical Summary         Notes:   cc: Dr. Jackelyn Knife     Signed by Jethro Bolus, M.D. on 06/15/16 at 2:29 PM (EDT)     The information contained in this medical record document is considered private and confidential patient information. This information can only be used for the medical diagnosis and/or medical services that are being provided by the patient's selected caregivers. This information can only be distributed outside of the patient's care if the patient agrees and signs waivers of authorization for this information to be sent to an outside source or  route.

## 2016-07-02 ENCOUNTER — Ambulatory Visit (HOSPITAL_BASED_OUTPATIENT_CLINIC_OR_DEPARTMENT_OTHER): Payer: BLUE CROSS/BLUE SHIELD | Admitting: Anesthesiology

## 2016-07-02 ENCOUNTER — Encounter (HOSPITAL_BASED_OUTPATIENT_CLINIC_OR_DEPARTMENT_OTHER): Payer: Self-pay | Admitting: *Deleted

## 2016-07-02 ENCOUNTER — Encounter (HOSPITAL_BASED_OUTPATIENT_CLINIC_OR_DEPARTMENT_OTHER)
Admission: RE | Disposition: A | Discharge status: Home / Self Care | Payer: Self-pay | Source: Ambulatory Visit | Attending: Urology

## 2016-07-02 ENCOUNTER — Ambulatory Visit (HOSPITAL_BASED_OUTPATIENT_CLINIC_OR_DEPARTMENT_OTHER)
Admission: RE | Admit: 2016-07-02 | Discharge: 2016-07-02 | Disposition: A | Discharge status: Home / Self Care | Payer: BLUE CROSS/BLUE SHIELD | Source: Ambulatory Visit | Attending: Urology | Admitting: Urology

## 2016-07-02 DIAGNOSIS — K219 Gastro-esophageal reflux disease without esophagitis: Secondary | ICD-10-CM | POA: Insufficient documentation

## 2016-07-02 DIAGNOSIS — Z87891 Personal history of nicotine dependence: Secondary | ICD-10-CM | POA: Insufficient documentation

## 2016-07-02 DIAGNOSIS — M4130 Thoracogenic scoliosis, site unspecified: Secondary | ICD-10-CM | POA: Diagnosis not present

## 2016-07-02 DIAGNOSIS — Z881 Allergy status to other antibiotic agents status: Secondary | ICD-10-CM | POA: Diagnosis not present

## 2016-07-02 DIAGNOSIS — Z79899 Other long term (current) drug therapy: Secondary | ICD-10-CM | POA: Insufficient documentation

## 2016-07-02 DIAGNOSIS — Z79891 Long term (current) use of opiate analgesic: Secondary | ICD-10-CM | POA: Insufficient documentation

## 2016-07-02 DIAGNOSIS — Z8739 Personal history of other diseases of the musculoskeletal system and connective tissue: Secondary | ICD-10-CM | POA: Diagnosis not present

## 2016-07-02 DIAGNOSIS — N3010 Interstitial cystitis (chronic) without hematuria: Secondary | ICD-10-CM

## 2016-07-02 DIAGNOSIS — F419 Anxiety disorder, unspecified: Secondary | ICD-10-CM | POA: Insufficient documentation

## 2016-07-02 HISTORY — DX: Barrett's esophagus without dysplasia: K22.70

## 2016-07-02 HISTORY — DX: Fracture of neck, unspecified, initial encounter: S12.9XXA

## 2016-07-02 HISTORY — PX: CYSTO WITH HYDRODISTENSION: SHX5453

## 2016-07-02 HISTORY — DX: Interstitial cystitis (chronic) without hematuria: N30.10

## 2016-07-02 HISTORY — DX: Other symptoms and signs involving the genitourinary system: R39.89

## 2016-07-02 HISTORY — DX: Postlaminectomy syndrome, not elsewhere classified: M96.1

## 2016-07-02 HISTORY — DX: Dorsalgia, unspecified: M54.9

## 2016-07-02 HISTORY — DX: Personal history of urinary calculi: Z87.442

## 2016-07-02 HISTORY — DX: Other chronic pain: G89.29

## 2016-07-02 HISTORY — DX: Chronic bladder pain: R39.82

## 2016-07-02 HISTORY — DX: Personal history of other infectious and parasitic diseases: Z86.19

## 2016-07-02 HISTORY — DX: Gastritis, unspecified, without bleeding: K29.70

## 2016-07-02 HISTORY — DX: Presence of spectacles and contact lenses: Z97.3

## 2016-07-02 LAB — POCT HEMOGLOBIN-HEMACUE: HEMOGLOBIN: 13.4 g/dL (ref 12.0–15.0)

## 2016-07-02 SURGERY — CYSTOSCOPY, WITH BLADDER HYDRODISTENSION
Anesthesia: General | Site: Bladder

## 2016-07-02 MED ORDER — FENTANYL CITRATE (PF) 100 MCG/2ML IJ SOLN
25.0000 ug | INTRAMUSCULAR | Status: DC | PRN
  Filled 2016-07-02: qty 1

## 2016-07-02 MED ORDER — CALCIUM GLYCEROPHOSPHATE 340 (65-50) MG (CA-P) PO TABS: 1.0000 | ORAL_TABLET | Freq: Three times a day (TID) | ORAL | 11 refills | Status: DC

## 2016-07-02 MED ORDER — PHENAZOPYRIDINE HCL 200 MG PO TABS
ORAL | Status: DC | PRN
  Administered 2016-07-02: 15 mL via INTRAVESICAL

## 2016-07-02 MED ORDER — STERILE WATER FOR IRRIGATION IR SOLN
Status: DC | PRN
  Administered 2016-07-02: 3000 mL via INTRAVESICAL

## 2016-07-02 MED ORDER — PHENAZOPYRIDINE HCL 200 MG PO TABS
200.0000 mg | ORAL_TABLET | ORAL | Status: AC
  Administered 2016-07-02: 200 mg via ORAL
  Filled 2016-07-02: qty 1

## 2016-07-02 MED ORDER — HYDROCODONE-ACETAMINOPHEN 7.5-325 MG PO TABS
1.0000 | ORAL_TABLET | Freq: Once | ORAL | Status: AC | PRN
  Administered 2016-07-02: 1 via ORAL
  Filled 2016-07-02: qty 1

## 2016-07-02 MED ORDER — MEPERIDINE HCL 25 MG/ML IJ SOLN
6.2500 mg | INTRAMUSCULAR | Status: DC | PRN
  Filled 2016-07-02: qty 1

## 2016-07-02 MED ORDER — URELLE 81 MG PO TABS
1.0000 | ORAL_TABLET | Freq: Four times a day (QID) | ORAL | Status: DC
  Administered 2016-07-02: 14:00:00 via ORAL
  Filled 2016-07-02: qty 1

## 2016-07-02 MED ORDER — LACTATED RINGERS IV SOLN
INTRAVENOUS | Status: DC
  Administered 2016-07-02: 11:00:00 via INTRAVENOUS
  Filled 2016-07-02: qty 1000

## 2016-07-02 MED ORDER — FENTANYL CITRATE (PF) 100 MCG/2ML IJ SOLN
INTRAMUSCULAR | Status: AC
  Filled 2016-07-02: qty 4

## 2016-07-02 MED ORDER — PHENAZOPYRIDINE HCL 100 MG PO TABS
ORAL_TABLET | ORAL | Status: AC
  Filled 2016-07-02: qty 2

## 2016-07-02 MED ORDER — TAPENTADOL HCL 100 MG PO TABS: 100.0000 mg | ORAL_TABLET | Freq: Four times a day (QID) | ORAL | 0 refills | Status: DC | PRN

## 2016-07-02 MED ORDER — BELLADONNA ALKALOIDS-OPIUM 16.2-60 MG RE SUPP
RECTAL | Status: DC | PRN
  Administered 2016-07-02: 1 via RECTAL

## 2016-07-02 MED ORDER — CEFAZOLIN SODIUM-DEXTROSE 2-4 GM/100ML-% IV SOLN
2.0000 g | INTRAVENOUS | Status: AC
  Administered 2016-07-02: 2 g via INTRAVENOUS
  Filled 2016-07-02: qty 100

## 2016-07-02 MED ORDER — FENTANYL CITRATE (PF) 100 MCG/2ML IJ SOLN
INTRAMUSCULAR | Status: DC | PRN
  Administered 2016-07-02: 50 ug via INTRAVENOUS
  Administered 2016-07-02: 25 ug via INTRAVENOUS

## 2016-07-02 MED ORDER — PROPOFOL 10 MG/ML IV BOLUS
INTRAVENOUS | Status: DC | PRN
  Administered 2016-07-02: 150 mg via INTRAVENOUS

## 2016-07-02 MED ORDER — PROMETHAZINE HCL 25 MG/ML IJ SOLN
6.2500 mg | INTRAMUSCULAR | Status: DC | PRN
  Filled 2016-07-02: qty 1

## 2016-07-02 MED ORDER — CEFAZOLIN SODIUM-DEXTROSE 2-4 GM/100ML-% IV SOLN
INTRAVENOUS | Status: AC
  Filled 2016-07-02: qty 100

## 2016-07-02 MED ORDER — LIDOCAINE 2% (20 MG/ML) 5 ML SYRINGE
INTRAMUSCULAR | Status: DC | PRN
  Administered 2016-07-02: 60 mg via INTRAVENOUS

## 2016-07-02 MED ORDER — MIDAZOLAM HCL 5 MG/5ML IJ SOLN
INTRAMUSCULAR | Status: DC | PRN
  Administered 2016-07-02: 2 mg via INTRAVENOUS

## 2016-07-02 MED ORDER — URELLE 81 MG PO TABS
ORAL_TABLET | ORAL | Status: AC
  Filled 2016-07-02: qty 1

## 2016-07-02 MED ORDER — BUPIVACAINE HCL (PF) 0.5 % IJ SOLN
INTRAMUSCULAR | Status: AC
  Filled 2016-07-02: qty 30

## 2016-07-02 MED ORDER — KETOROLAC TROMETHAMINE 30 MG/ML IJ SOLN
INTRAMUSCULAR | Status: AC
  Filled 2016-07-02: qty 1

## 2016-07-02 MED ORDER — URELLE 81 MG PO TABS: 1.0000 | ORAL_TABLET | Freq: Three times a day (TID) | ORAL | 2 refills | Status: DC

## 2016-07-02 MED ORDER — LIDOCAINE 2% (20 MG/ML) 5 ML SYRINGE
INTRAMUSCULAR | Status: AC
  Filled 2016-07-02: qty 5

## 2016-07-02 MED ORDER — TRIAMCINOLONE ACETONIDE 40 MG/ML IJ SUSP
INTRAMUSCULAR | Status: AC
  Filled 2016-07-02: qty 1

## 2016-07-02 MED ORDER — DEXAMETHASONE SODIUM PHOSPHATE 10 MG/ML IJ SOLN
INTRAMUSCULAR | Status: DC | PRN
  Administered 2016-07-02: 10 mg via INTRAVENOUS

## 2016-07-02 MED ORDER — MIDAZOLAM HCL 2 MG/2ML IJ SOLN
INTRAMUSCULAR | Status: AC
Start: 2016-07-02 — End: 2016-07-02
  Filled 2016-07-02: qty 2

## 2016-07-02 MED ORDER — BELLADONNA ALKALOIDS-OPIUM 16.2-60 MG RE SUPP
RECTAL | Status: AC
  Filled 2016-07-02: qty 1

## 2016-07-02 MED ORDER — HYDROCODONE-ACETAMINOPHEN 7.5-325 MG PO TABS
ORAL_TABLET | ORAL | Status: AC
  Filled 2016-07-02: qty 1

## 2016-07-02 MED ORDER — ONDANSETRON HCL 4 MG/2ML IJ SOLN
INTRAMUSCULAR | Status: DC | PRN
  Administered 2016-07-02: 4 mg via INTRAVENOUS

## 2016-07-02 MED ORDER — TRIAMCINOLONE ACETONIDE 40 MG/ML IJ SUSP
INTRAMUSCULAR | Status: DC | PRN
  Administered 2016-07-02: 40 mg via INTRAMUSCULAR

## 2016-07-02 MED ORDER — ACETAMINOPHEN 500 MG PO TABS
ORAL_TABLET | ORAL | Status: AC
  Filled 2016-07-02: qty 2

## 2016-07-02 MED ORDER — KETOROLAC TROMETHAMINE 30 MG/ML IJ SOLN
INTRAMUSCULAR | Status: DC | PRN
Start: 2016-07-02 — End: 2016-07-02
  Administered 2016-07-02: 30 mg via INTRAVENOUS

## 2016-07-02 MED ORDER — ACETAMINOPHEN 500 MG PO TABS
1000.0000 mg | ORAL_TABLET | ORAL | Status: DC
  Filled 2016-07-02: qty 2

## 2016-07-02 SURGICAL SUPPLY — 33 items
ADAPTER CATH WHT DISP STRL (CATHETERS) IMPLANT
ADPR CATH MP STRL LF DISP BD (CATHETERS)
BAG DRAIN URO-CYSTO SKYTR STRL (DRAIN) ×3 IMPLANT
BAG DRN UROCATH (DRAIN) ×1
BOOTIES KNEE HIGH SLOAN (MISCELLANEOUS) ×3 IMPLANT
CATH ROBINSON RED A/P 16FR (CATHETERS) ×2 IMPLANT
CLOTH BEACON ORANGE TIMEOUT ST (SAFETY) ×3 IMPLANT
ELECT REM PT RETURN 9FT ADLT (ELECTROSURGICAL) ×3
ELECTRODE REM PT RTRN 9FT ADLT (ELECTROSURGICAL) ×1 IMPLANT
GLOVE BIO SURGEON STRL SZ 6.5 (GLOVE) ×1 IMPLANT
GLOVE BIO SURGEON STRL SZ7.5 (GLOVE) ×3 IMPLANT
GLOVE BIO SURGEONS STRL SZ 6.5 (GLOVE) ×1
GLOVE BIOGEL PI IND STRL 6.5 (GLOVE) IMPLANT
GLOVE BIOGEL PI INDICATOR 6.5 (GLOVE) ×2
GLOVE ECLIPSE 8.0 STRL XLNG CF (GLOVE) ×2 IMPLANT
GOWN STRL REUS W/ TWL LRG LVL3 (GOWN DISPOSABLE) ×1 IMPLANT
GOWN STRL REUS W/ TWL XL LVL3 (GOWN DISPOSABLE) ×1 IMPLANT
GOWN STRL REUS W/TWL LRG LVL3 (GOWN DISPOSABLE) ×3
GOWN STRL REUS W/TWL XL LVL3 (GOWN DISPOSABLE) ×3
KIT RM TURNOVER CYSTO AR (KITS) ×3 IMPLANT
MANIFOLD NEPTUNE II (INSTRUMENTS) ×2 IMPLANT
NDL SAFETY ECLIPSE 18X1.5 (NEEDLE) ×1 IMPLANT
NDL SPNL 22GX7 QUINCKE BK (NEEDLE) IMPLANT
NEEDLE HYPO 18GX1.5 SHARP (NEEDLE) ×3
NEEDLE HYPO 22GX1.5 SAFETY (NEEDLE) ×2 IMPLANT
NEEDLE SPNL 22GX7 QUINCKE BK (NEEDLE) ×3 IMPLANT
NS IRRIG 500ML POUR BTL (IV SOLUTION) IMPLANT
PACK CYSTO (CUSTOM PROCEDURE TRAY) ×3 IMPLANT
SYR 20CC LL (SYRINGE) ×6 IMPLANT
SYR BULB IRRIGATION 50ML (SYRINGE) IMPLANT
TUBE CONNECTING 12'X1/4 (SUCTIONS) ×1
TUBE CONNECTING 12X1/4 (SUCTIONS) ×1 IMPLANT
WATER STERILE IRR 3000ML UROMA (IV SOLUTION) ×3 IMPLANT

## 2016-07-02 NOTE — Interval H&P Note (Signed)
History and Physical Interval Note:  07/02/2016 12:07 PM  Anne [name]  has presented today for surgery, with the diagnosis of interstitial cystitis  The various methods of treatment have been discussed with the patient and family. After consideration of risks, benefits and other options for treatment, the patient has consented to  Procedure(s): CYSTOSCOPY/HYDRODISTENSION (N/A) as a surgical intervention .  The patient's history has been reviewed, patient examined, no change in status, stable for surgery.  I have reviewed the patient's chart and labs.  Questions were answered to the patient's satisfaction.     Ashlin Kreps I Erandy Mceachern

## 2016-07-02 NOTE — Transfer of Care (Signed)
Immediate Anesthesia Transfer of Care Note  Patient: Anne [name]  Procedure(s) Performed: Procedure(s): CYSTOSCOPY/HYDRODISTENSION (N/A)  Patient Location: PACU  Anesthesia Type:General  Level of Consciousness: awake, alert , oriented and patient cooperative  Airway & Oxygen Therapy: Patient Spontanous Breathing and Patient connected to nasal cannula oxygen  Post-op Assessment: Report given to RN and Post -op Vital signs reviewed and stable  Post vital signs: Reviewed and stable  Last Vitals:  Vitals:   07/02/16 1100  BP: 133/63  Pulse: (!) 58  Resp: 18  Temp: 37.1 C    Last Pain:  Vitals:   07/02/16 1100  TempSrc: Oral         Complications: No apparent anesthesia complications

## 2016-07-02 NOTE — Anesthesia Postprocedure Evaluation (Signed)
Anesthesia Post Note  Patient: Anne [name]  Procedure(s) Performed: Procedure(s) (LRB): CYSTOSCOPY/HYDRODISTENSION (N/A)  Patient location during evaluation: PACU Anesthesia Type: General Level of consciousness: awake and alert Pain management: pain level controlled Vital Signs Assessment: post-procedure vital signs reviewed and stable Respiratory status: spontaneous breathing, nonlabored ventilation, respiratory function stable and patient connected to nasal cannula oxygen Cardiovascular status: blood pressure returned to baseline and stable Postop Assessment: no signs of nausea or vomiting Anesthetic complications: no       Last Vitals:  Vitals:   07/02/16 1330 07/02/16 1345  BP: (!) 151/89 (!) 155/87  Pulse: 62 (!) 56  Resp: 16 12  Temp:      Last Pain:  Vitals:   07/02/16 1345  TempSrc:   PainSc: 7                  Cecile Hearing

## 2016-07-02 NOTE — Discharge Instructions (Addendum)
Hydrodistention of the Bladder, Care After Refer to this sheet in the next few weeks. These instructions provide you with information about caring for yourself after your procedure. Your health care provider may also give you more specific instructions. Your treatment has been planned according to current medical practices, but problems sometimes occur. Call your health care provider if you have any problems or questions after your procedure. What can I expect after the procedure? After the procedure, it is common to have:  Soreness and mild discomfort in your lower abdomen.  Mild pain when you urinate. Pain should stop within a few minutes after you urinate. This may last for up to a week.  A small amount of blood in your urine. Follow these instructions at home: Medicines   Take over-the-counter and prescription medicines only as told by your health care provider.  Do not drive for 24 hours if you received a sedative.  Do not drive or operate heavy machinery while taking prescription pain medicine.  If you were prescribed an antibiotic medicine, take it as told by your health care provider. Do not stop taking the antibiotic even if you start to feel better. Lifestyle   Limit alcohol intake to no more than 1 drink a day for nonpregnant women and 2 drinks a day for men. One drink equals 12 oz of beer, 5 oz of wine, or 1 oz of hard liquor.  Do not use any tobacco products, such as cigarettes, chewing tobacco, and e-cigarettes. If you need help quitting, ask your health care provider. Activity   Return to your normal activities as told by your health care provider. Ask your health care provider what activities are safe for you.  Do not lift anything that is heavier than 10 lb (4.5 kg) until your health care approves. Eating and drinking    Follow instructions from your health care provider about eating or drinking restrictions.  Drink enough fluid to keep your urine clear or pale  yellow. General instructions   Do not take baths, swim, or use a hot tub until your health care provider approves.  Wear compression stockings as told by your health care provider. These stockings help to prevent blood clots and reduce swelling in your legs.  If a tissue sample was removed for testing (biopsy) during your procedure, it is your responsibility to get your test results. Ask your health care provider or the department performing the test when your results will be ready.  Keep all follow-up visits as told by your health care provider. This is important. Contact a health care provider if:  You have blood clots in your urine.  You have pain that gets worse or does not get better with medicine, especially pain when you urinate.  You have difficulty urinating.  You feel nauseous or you vomit for more than 2 days after the procedure.  You have a fever. Get help right away if:  You have severe pain in your abdomen.  You cannot urinate. This information is not intended to replace advice given to you by your health care provider. Make sure you discuss any questions you have with your health care provider. Document Released: 02/28/2015 Document Revised: 08/25/2015 Document Reviewed: 12/29/2014 Elsevier Interactive Patient Education  2017 [employer]    Post Anesthesia Home Care Instructions  Activity: Get plenty of rest for the remainder of the day. A responsible individual must stay with you for 24 hours following the procedure.  For the next 24 hours, DO  NOT: -Drive a car Environmental consultant -Drink alcoholic beverages -Take any medication unless instructed by your physician -Make any legal decisions or sign important papers.  Meals: Start with liquid foods such as gelatin or soup. Progress to regular foods as tolerated. Avoid greasy, spicy, heavy foods. If nausea and/or vomiting occur, drink only clear liquids until the nausea and/or vomiting subsides. Call your  physician if vomiting continues.  Special Instructions/Symptoms: Your throat may feel dry or sore from the anesthesia or the breathing tube placed in your throat during surgery. If this causes discomfort, gargle with warm salt water. The discomfort should disappear within 24 hours.  If you had a scopolamine patch placed behind your ear for the management of post- operative nausea and/or vomiting:  1. The medication in the patch is effective for 72 hours, after which it should be removed.  Wrap patch in a tissue and discard in the trash. Wash hands thoroughly with soap and water. 2. You may remove the patch earlier than 72 hours if you experience unpleasant side effects which may include dry mouth, dizziness or visual disturbances. 3. Avoid touching the patch. Wash your hands with soap and water after contact with the patch.

## 2016-07-02 NOTE — Op Note (Signed)
Pre-operative diagnosis : Interstitial cystitis  Postoperative diagnosis: Same  Operation:  Cystoscopy, hydrodistention the bladder (400 mL to 650 mL with gravity distention; instillation of Marcaine/Pyridium; injection of Marcaine/Kenalog solution in the subtrigonal space  Surgeon:  S. Patsi Sears, MD  First assistant:  None  Anesthesia:  Gen. LMA  Preparation:  After appropriate anesthesia, the patient about the operative room, placed on the operating table in the dorsal supine position where general LMA anesthesia was introduced. She received IV antibiotic.  Review history: Anne [name] is a 51 year-old female patient who was referred by Lavina Hamman who is here for bladder pain.  The patient states the nature of her problem(s) is frequency and pain. Her symptoms have been present for 2 months. She does have urinary urgency. She denies daytime urinary leakage.   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 Evalyn Casco, referred by Dr. Jackelyn Knife for continued evaluation and Rx of Interstitial cystitis. .  In 2008, she was seen for gross hematuria, with CT showing no abnormalities. She ha had a remote hx of tobascco use ( 5 yrs x ? # pks); and cysto was attempted by Dr. Earlene Plater.( pt traumatized)  She has been started on Elmiron  TID, which has helped her somewhat. Her puff score today equals 24, with dyspareunia, urgency, vaginal and pelvic pain. She has had multiple negative urinary cultures.       Statement of  Likelihood of Success: Excellent. TIME-OUT observed.:  Procedure:  Vaginal inspection revealed normal-appearing vaginal mucosa. Cystourethroscopy was accomplished, and showed normal-appearing bladder, with normal trigone, and clear reflux from both ureteral orifices. There was no evidence of bladder stone, tumor, or bladder diverticular formation. The bladder was small, holding only 400 mL.  Repeat cystoscopy showed typical  erythematous ulcerations, and photo documentation was accomplished. Also, bladder neck polyps are identified upon removal of the cystoscope.  The bladder was hydrodistended to 650 mL. Following drainage of the bladder, a red Robinson catheter was placed, and peridium Marcaine solution was inserted in the bladder. The catheter was removed.  Marcaine/cortisone solution was then injected in the subtrigonal space, in an effort to give the patient postoperative bladder analgesia and decreased frequency and urgency.  The patient received IV Toradol, and was awakened and taken to recovery room in good condition. She received a B and O suppository at the beginning of the procedure.

## 2016-07-02 NOTE — Anesthesia Preprocedure Evaluation (Addendum)
Anesthesia Evaluation  Patient identified by MRN, date of birth, ID band Patient awake    Reviewed: Allergy & Precautions, NPO status , Patient's Chart, lab work & pertinent test results  Airway Mallampati: I       Dental no notable dental hx. (+) Teeth Intact, Caps, Dental Advisory Given   Pulmonary former smoker,  Hx/o bronchospasm though due to GERD, used inhaler, much less frequently since GERD controlled   Pulmonary exam normal breath sounds clear to auscultation       Cardiovascular Exercise Tolerance: Good negative cardio ROS Normal cardiovascular exam Rhythm:Regular Rate:Normal     Neuro/Psych  Headaches, PSYCHIATRIC DISORDERS Anxiety Depression    GI/Hepatic Neg liver ROS, GERD  Medicated and Controlled,Hx/o Barrett's esophagus   Endo/Other  negative endocrine ROS  Renal/GU Hx/o renal calculi Bladder dysfunction  Chronic bladder pain    Musculoskeletal  (+) Arthritis , Osteoarthritis,  Scoliosis lumbar spine Post laminectomy syndrome   Abdominal   Peds  Hematology negative hematology ROS (+)   Anesthesia Other Findings   Reproductive/Obstetrics                            Anesthesia Physical Anesthesia Plan  ASA: II  Anesthesia Plan: General   Post-op Pain Management:    Induction: Intravenous  Airway Management Planned: LMA  Additional Equipment:   Intra-op Plan:   Post-operative Plan: Extubation in OR  Informed Consent: I have reviewed the patients History and Physical, chart, labs and discussed the procedure including the risks, benefits and alternatives for the proposed anesthesia with the patient or authorized representative who has indicated his/her understanding and acceptance.   Dental advisory given  Plan Discussed with: CRNA, Anesthesiologist and Surgeon  Anesthesia Plan Comments:         Anesthesia Quick Evaluation

## 2016-07-02 NOTE — Anesthesia Procedure Notes (Addendum)
Procedure Name: LMA Insertion Date/Time: 07/02/2016 12:30 PM Performed by: Tyrone Nine Pre-anesthesia Checklist: Patient identified, Timeout performed, Emergency Drugs available, Suction available and Patient being monitored Patient Re-evaluated:Patient Re-evaluated prior to inductionOxygen Delivery Method: Circle system utilized Preoxygenation: Pre-oxygenation with 100% oxygen Intubation Type: IV induction Ventilation: Mask ventilation without difficulty LMA: LMA inserted LMA Size: 4.0 Number of attempts: 1 Placement Confirmation: positive ETCO2 and breath sounds checked- equal and bilateral Tube secured with: Tape Dental Injury: Teeth and Oropharynx as per pre-operative assessment

## 2016-07-03 ENCOUNTER — Encounter (HOSPITAL_BASED_OUTPATIENT_CLINIC_OR_DEPARTMENT_OTHER): Payer: Self-pay | Admitting: Urology

## 2016-07-03 ENCOUNTER — Other Ambulatory Visit: Payer: Self-pay | Admitting: Family Medicine

## 2016-07-03 DIAGNOSIS — F4323 Adjustment disorder with mixed anxiety and depressed mood: Secondary | ICD-10-CM

## 2016-07-25 ENCOUNTER — Telehealth: Payer: Self-pay | Admitting: Internal Medicine

## 2016-07-25 MED ORDER — DEXLANSOPRAZOLE 60 MG PO CPDR: 60.0000 mg | DELAYED_RELEASE_CAPSULE | Freq: Every day | ORAL | 0 refills | Status: DC

## 2016-07-25 NOTE — Telephone Encounter (Signed)
Rx sent 

## 2016-07-27 ENCOUNTER — Ambulatory Visit (INDEPENDENT_AMBULATORY_CARE_PROVIDER_SITE_OTHER): Payer: BLUE CROSS/BLUE SHIELD | Admitting: Pulmonary Disease

## 2016-07-27 DIAGNOSIS — R0602 Shortness of breath: Secondary | ICD-10-CM

## 2016-07-27 LAB — PULMONARY FUNCTION TEST
DL/VA % PRED: 83 %
DL/VA: 4.31 ml/min/mmHg/L
DLCO COR % PRED: 82 %
DLCO COR: 23.86 ml/min/mmHg
DLCO unc % pred: 85 %
DLCO unc: 24.76 ml/min/mmHg
FEF 25-75 PRE: 2.55 L/s
FEF 25-75 Post: 1.8 L/sec
FEF2575-%Change-Post: -29 %
FEF2575-%Pred-Post: 61 %
FEF2575-%Pred-Pre: 88 %
FEV1-%Change-Post: -9 %
FEV1-%Pred-Post: 79 %
FEV1-%Pred-Pre: 88 %
FEV1-Post: 2.49 L
FEV1-Pre: 2.74 L
FEV1FVC-%CHANGE-POST: -9 %
FEV1FVC-%Pred-Pre: 96 %
FEV6-%CHANGE-POST: -1 %
FEV6-%PRED-PRE: 90 %
FEV6-%Pred-Post: 89 %
FEV6-PRE: 3.49 L
FEV6-Post: 3.42 L
FEV6FVC-%Change-Post: 0 %
FEV6FVC-%PRED-PRE: 103 %
FEV6FVC-%Pred-Post: 102 %
FVC-%Change-Post: 0 %
FVC-%PRED-POST: 90 %
FVC-%Pred-Pre: 89 %
FVC-POST: 3.56 L
FVC-Pre: 3.55 L
POST FEV6/FVC RATIO: 99 %
Post FEV1/FVC ratio: 70 %
Pre FEV1/FVC ratio: 77 %
Pre FEV6/FVC Ratio: 100 %
RV % PRED: 127 %
RV: 2.53 L
TLC % pred: 93 %
TLC: 5.19 L

## 2016-07-27 NOTE — Telephone Encounter (Signed)
Patient states that [facility] is telling her that they did not receive Rx. Please resend and patient is requesting a callback when sent. Best # [phone]

## 2016-07-27 NOTE — Telephone Encounter (Signed)
Patient states that that [facility] actually called her later this morning to tell her that they have her prescription and are processing it.

## 2016-07-27 NOTE — Progress Notes (Signed)
PFT done today. 

## 2016-08-01 ENCOUNTER — Institutional Professional Consult (permissible substitution): Payer: BLUE CROSS/BLUE SHIELD | Admitting: Pulmonary Disease

## 2016-08-09 ENCOUNTER — Ambulatory Visit (HOSPITAL_COMMUNITY): Payer: BLUE CROSS/BLUE SHIELD

## 2016-08-09 ENCOUNTER — Ambulatory Visit (INDEPENDENT_AMBULATORY_CARE_PROVIDER_SITE_OTHER): Payer: BLUE CROSS/BLUE SHIELD | Admitting: Internal Medicine

## 2016-08-09 ENCOUNTER — Telehealth: Payer: Self-pay | Admitting: Pulmonary Disease

## 2016-08-09 VITALS — BP 112/80 | HR 90 | Ht 67.0 in | Wt 118.0 lb

## 2016-08-09 DIAGNOSIS — R0609 Other forms of dyspnea: Secondary | ICD-10-CM

## 2016-08-09 NOTE — Progress Notes (Signed)
Anne [name]    [id number]    [birth year]  Primary Care Physician:COPLAND,JESSICA, MD     Chief complaint:  Consult for evaluation of dyspnea.  HPI: Anne [name] is a 13 yowf quit smoking 1989  with PMH of GERD, Barrett's esophagitis, gastritis, recurrent C. Difficile and s/p Cx Neck surgery 08/2013 with nl PFTs  07/27/16   06/20/16 eval by Dr Isaiah Serge She was evaluated one week prior to OV   for worsening of her acid reflux with daily heartburn symptoms, chest discomfort for the past 3 months prior to OV . Coincident with this she reports dyspnea on exertion. She denies any cough, sputum production, wheezing, hemoptysis. She reports using several pounds over the past few weeks.  She had a chest x-ray which showed hyperinflation and has been referred for further evaluation to the pulmonary office. She has minimal smoking history of about 3 pack years and quit in the 1980s. 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 She has occasional seasonal allergies. She denies any postnasal drip.  rec Will schedule for pulmonary function tests > neg Will give an albuterol rescue inhaler to be used as needed    . 08/09/2016  f/u ov/Anne [name] re: unexplained sob  Chief Complaint  Patient presents with  . Acute Visit    Pt states having increased SOB since last visit. She states she feels like she is unable to take in a good deep breath. She states her rib area feels painful and "pulsates".    doe x 1st of the year 2018 progressively whereas previously able to walk uphill to work  Also uncomfortable at rest After Remus Loffler not aware of a resp problem   No cough at all / ?sob  slt better on albuterol but still sob can't carry on a conversation or do her job as a Tour manager    Midline lower chest pain like an arrow thru to her back, does not lateralize persistent daily since the first 2018  Only some Better in supine position and worse if eat 15 min after eat /   the  more she eats the worse it hurts  / spicy makes it worse  Minimally worse with deep breath, a lot better supine - no assoc n or v             Physical Exam: amb hoarse wf nad with unusual breathing pattern at rest like a gasp, tends to be evasive with questions but smiled when I mentioned this to her  Wt Readings from Last 3 Encounters:  08/09/16 118 lb (53.5 kg)  07/02/16 120 lb (54.4 kg)  06/21/16 121 lb (54.9 kg)    Vital signs reviewed - - Note on arrival 02 sats  99% on RA      HEENT: nl dentition, turbinates bilaterally, and oropharynx. Nl external ear canals without cough reflex   NECK :  without JVD/Nodes/TM/ nl carotid upstrokes bilaterally   LUNGS: no acc muscle use,  Nl contour chest which is clear to A and P bilaterally without cough on insp or exp maneuvers   CV:  RRR  no s3 or murmur or increase in P2, and no edema   ABD:  soft and nontender with nl inspiratory excursion in the supine position. No bruits or organomegaly appreciated, bowel sounds nl  MS:  Nl gait/ ext warm without deformities, calf tenderness, cyanosis or clubbing No obvious joint restrictions  SKIN: warm and dry without lesions    NEURO:  alert, approp, nl sensorium with  no motor or cerebellar deficits apparent.         Data Reviewed: FENO 06/20/16- 7  CXR 3/414/17-hyperinflation, no acute pulmonary abnormality CT abdomen 03/18/15 report-lung images do not show any pulmonary abnormality

## 2016-08-09 NOTE — Telephone Encounter (Addendum)
Pt called answering service to inquire about the pre treatment for CT scan scheduled for tomorrow. She was told by office that due to contrast allergy a pretreatment regimen will be called in to her pharmacy but not done yet.  Called in standard regimen. Prednisone - 50 mg by mouth at 13 hours, 7 hours, and 1 hour before contrast media injection, plus Diphenhydramine - 50 mg by mouth 1 hour before contrast medium. Will cc office to make sure this was the intended prescription.   Chilton GreathousePraveen Sahirah Rudell MD [facility] 08/09/2016, 8:08 PM

## 2016-08-09 NOTE — Patient Instructions (Signed)
Please see patient coordinator before you leave today  to schedule CTa next available  For drainage / throat tickle try take CHLORPHENIRAMINE  4 mg - take one every 4 hours as needed - available over the counter- may cause drowsiness so start with just a bedtime dose or two and see how you tolerate it before trying in daytime    Dexilant  60 mg   Take  30-60 min before first meal of the day and Pepcid (famotidine)  20 mg one @  bedtime until return to office - this is the best way to tell whether stomach acid is contributing to your problem.    GERD (REFLUX)  is an extremely common cause of respiratory symptoms just like yours , many times with no obvious heartburn at all.    It can be treated with medication, but also with lifestyle changes including elevation of the head of your bed (ideally with 6 inch  bed blocks),  Smoking cessation, avoidance of late meals, excessive alcohol, and avoid fatty foods, chocolate, peppermint, colas, red wine, and acidic juices such as orange juice.  NO MINT OR MENTHOL PRODUCTS SO NO COUGH DROPS   USE SUGARLESS CANDY INSTEAD (Jolley ranchers or Stover's or Life Savers) or even ice chips will also do - the key is to swallow to prevent all throat clearing. NO OIL BASED VITAMINS - use powdered substitutes.   You may need to consider changes in your birth control / female hormones with  Less progesterone.   To get the most out of exercise, you need to be continuously aware that you are short of breath, but never out of breath, for 30 minutes daily. As you improve, it will actually be easier for you to do the same amount of exercise  in  30 minutes so always push to the level where you are short of breath.

## 2016-08-10 ENCOUNTER — Encounter: Payer: Self-pay | Admitting: Internal Medicine

## 2016-08-10 ENCOUNTER — Telehealth: Payer: Self-pay | Admitting: Pulmonary Disease

## 2016-08-10 ENCOUNTER — Ambulatory Visit (HOSPITAL_COMMUNITY)
Admission: RE | Admit: 2016-08-10 | Discharge: 2016-08-10 | Disposition: A | Discharge status: Home / Self Care | Payer: BLUE CROSS/BLUE SHIELD | Source: Ambulatory Visit | Attending: Internal Medicine | Admitting: Internal Medicine

## 2016-08-10 DIAGNOSIS — R0609 Other forms of dyspnea: Secondary | ICD-10-CM | POA: Insufficient documentation

## 2016-08-10 DIAGNOSIS — Z981 Arthrodesis status: Secondary | ICD-10-CM | POA: Insufficient documentation

## 2016-08-10 DIAGNOSIS — M419 Scoliosis, unspecified: Secondary | ICD-10-CM | POA: Diagnosis not present

## 2016-08-10 DIAGNOSIS — Z9882 Breast implant status: Secondary | ICD-10-CM | POA: Insufficient documentation

## 2016-08-10 MED ORDER — IOPAMIDOL (ISOVUE-370) INJECTION 76%
100.0000 mL | Freq: Once | INTRAVENOUS | Status: AC | PRN
  Administered 2016-08-10: 100 mL via INTRAVENOUS

## 2016-08-10 MED ORDER — IOPAMIDOL (ISOVUE-370) INJECTION 76%
INTRAVENOUS | Status: AC
  Filled 2016-08-10: qty 100

## 2016-08-10 NOTE — Telephone Encounter (Signed)
See result note 08/10/16

## 2016-08-10 NOTE — Progress Notes (Signed)
Spoke with pt and notified of results per Dr. Wert. Pt verbalized understanding and denied any questions. 

## 2016-08-10 NOTE — Telephone Encounter (Signed)
Done thru result note

## 2016-08-10 NOTE — Assessment & Plan Note (Addendum)
FENO  06/20/16 7  PFTs  07/27/16 wnl and no change p saba/ nl f/v loop and dlco - 08/09/2016  Walked RA x 3 laps @ 185 ft each stopped due to  End of study, moderate pace, sats 92% at end  - CTa chest 08/10/2016    Symptoms are markedly disproportionate to objective findings and not clear this is a lung problem but pt does appear to have difficult airway management issues. DDX of  difficult airways management almost all start with A and  include Adherence, Ace Inhibitors, Acid Reflux, Active Sinus Disease, Alpha 1 Antitripsin deficiency, Anxiety masquerading as Airways dz,  ABPA,  Allergy(esp in young), Aspiration (esp in elderly), Adverse effects of meds,  Active smokers, A bunch of PE's (a small clot burden can't cause this syndrome unless there is already severe underlying pulm or vascular dz with poor reserve) plus two Bs  = Bronchiectasis and Beta blocker use..and one C= CHF   Adherence is always the initial "prime suspect" and is a multilayered concern that requires a "trust but verify" approach in every patient - starting with knowing how to use medications, especially inhalers, correctly, keeping up with refills and understanding the fundamental difference between maintenance and prns vs those medications only taken for a very short course and then stopped and not refilled.   ? Acid (or non-acid "reflex to reflux") GERD > always difficult to exclude as up to 75% of pts in some series report no assoc GI/ Heartburn symptoms> rec continue max (24h)  acid suppression and diet restrictions/ reviewed     ? Anxiety > usually at the bottom of this list of usual suspects but should be much higher on this pt's based on H and P and note already on psychotropics .   ? Adverse effects of meds:  Concerned on too many meds for her age esp BCP's of concern here for effect on LES in pt with Barrett's and risk for PE> advised to discuss with provider alternatives  ? Allergy/ asthma > no true resp to saba, nl  feno, no assoc cough or rhinitis make  This untenable dx  ? A bunch of PE's > needs to be excluded to be complete esp with sats dropping a bit on exertion but I strongly doubt and she's allergic to contrast dye but also very focused on "you are missing something and my dad died of an aneurysm"  So reasonable to poceed with CTa here but will need the protocol for contrast "allergy" (which she denies being a problem but listed in EPIC so since this is not an emergency situation can be done 5/11 p following the standard pre-CT contrast guidelines)   Discussed in detail all the  indications, usual  risks and alternatives  relative to the benefits with patient who agrees to proceed with rx and w/u as outlined and f/u with Dr Isaiah SergeMannam as planned    I had an extended discussion with the patient reviewing all relevant studies completed to date and  lasting 25 minutes of a 40  minute acute visit with pt not previously known to me     re  severe non-specific but potentially very serious refractory respiratory symptoms of uncertain and potentially multiple  etiologies.   Each maintenance medication was reviewed in detail including most importantly the difference between maintenance and prns and under what circumstances the prns are to be triggered using an action plan format that is not reflected in the computer generated alphabetically organized AVS.  Please see AVS for specific instructions unique to this office visit that I personally wrote and verbalized to the the pt in detail and then reviewed with pt  by my nurse highlighting any changes in therapy/plan of care  recommended at today's visit.

## 2016-08-10 NOTE — Telephone Encounter (Signed)
MW  Please Advise in PM's absence-  [facility] from pt'a CT Angio. Please advise if there is anything we need to inform the pt.    Study Result   CLINICAL DATA:  Dyspnea on exertion.  Shortness of breath.  EXAM: CT ANGIOGRAPHY CHEST WITH CONTRAST  TECHNIQUE: Multidetector CT imaging of the chest was performed using the standard protocol during bolus administration of intravenous contrast. Multiplanar CT image reconstructions and MIPs were obtained to evaluate the vascular anatomy.  CONTRAST:  100 mL Isovue 370  COMPARISON:  Two-view chest x-ray 06/13/2016  FINDINGS: Cardiovascular: The heart size is normal. Aortic arch and great vessels are within normal limits.  Pulmonary arterial opacification is satisfactory. There are no focal filling defects to suggest pulmonary emboli.  Mediastinum/Nodes: No significant mediastinal or axillary adenopathy is present.  Lungs/Pleura: The lungs are clear. No focal nodule or mass lesion is present. There is no significant airspace disease.  Upper Abdomen: Within normal limits.  Musculoskeletal: Scoliosis is present in the thoracic spine, convex to the right at C6-7 and to the left in the lower thoracic spine. Vertebral body heights and AP alignment are maintained. Cervical spine fusion is noted. Bilateral supra pectoral breast implants are noted.  Review of the MIP images confirms the above findings.  IMPRESSION: 1. No pulmonary embolus. 2. No acute or focal airspace disease to explain the patient's symptoms. 3. Scoliosis.   Electronically Signed   By: Marin Robertshristopher  Mattern M.D.   On: 08/10/2016 16:27

## 2016-08-19 ENCOUNTER — Other Ambulatory Visit: Payer: Self-pay | Admitting: Family Medicine

## 2016-08-19 DIAGNOSIS — F4323 Adjustment disorder with mixed anxiety and depressed mood: Secondary | ICD-10-CM

## 2016-08-21 ENCOUNTER — Encounter: Payer: Self-pay | Admitting: Pulmonary Disease

## 2016-08-21 ENCOUNTER — Ambulatory Visit (INDEPENDENT_AMBULATORY_CARE_PROVIDER_SITE_OTHER): Payer: BLUE CROSS/BLUE SHIELD | Admitting: Pulmonary Disease

## 2016-08-21 VITALS — BP 116/70 | HR 82 | Ht 67.0 in | Wt 117.8 lb

## 2016-08-21 DIAGNOSIS — R0602 Shortness of breath: Secondary | ICD-10-CM | POA: Diagnosis not present

## 2016-08-21 LAB — NITRIC OXIDE: NITRIC OXIDE: 10

## 2016-08-21 NOTE — Progress Notes (Signed)
Anne FlavorsKristian N [name]    [id number]    02/18/1966  Primary Care Physician:Copland, Gwenlyn FoundJessica C, MD  Referring Physician: Pearline Cablesopland, Jessica C, MD [address] [place], [place] [address]  Chief complaint:  Follow-up for evaluation of dyspnea.  HPI: [name] is a 51 Y/O with PMH of GERD, Barrett's esophagitis, gastritis, recurrent C. difficile. She was evaluated last week for worsening of her acid reflux with daily heartburn symptoms, chest discomfort for the past 3 months. Coincident with this she reports dyspnea on exertion. She denies any cough, sputum production, wheezing, hemoptysis. She reports using several pounds over the past few weeks.  She had a chest x-ray which showed hyperinflation and has been referred for further evaluation to the pulmonary office. She has minimal smoking history of about 3 pack years and quit in the 1980s. She works as a Advertising copywritertrial lawyer with no known exposures at work or at home. She has occasional seasonal allergies. She denies any postnasal drip.  Interim History:  She saw Dr. Sherene SiresWert on 5/10 after she had an acute episode of pain below the ribs, epigastric pain, dyspnea. She had a CT angiogram done the next day which did not show any pulmonary embolism or acute abnormality. She returns today stating that his symptoms are slowly improving. She still has episodes of some discomfort below the ribs, inability to take deep breath which is exacerbated after a long day at work. She still using the albuterol as needed but this does not help much.  Outpatient Encounter Prescriptions as of 08/21/2016  Medication Sig  . albuterol (PROVENTIL HFA;VENTOLIN HFA) 108 (90 Base) MCG/ACT inhaler Inhale 2 puffs into the lungs every 6 (six) hours as needed for wheezing or shortness of breath.  . ALPRAZolam (XANAX) 0.5 MG tablet Take 0.5 mg by mouth as needed.  . busPIRone (BUSPAR) 7.5 MG tablet TAKE 1 TO 2 TABLETS BY MOUTH TWICE DAILY AS NEEDED FOR ANXIETY  .  busPIRone (BUSPAR) 7.5 MG tablet TAKE 1-2 TABLETS TWICE DAILY AS NEEDED FOR ANXIETY  . CYCLAFEM 1/35 tablet Take 1 tablet by mouth every evening.   Marland Kitchen. dexlansoprazole (DEXILANT) 60 MG capsule Take 1 capsule (60 mg total) by mouth daily before breakfast.  . estradiol (ESTRACE) 0.1 MG/GM vaginal cream I 1 GRAM VAGINALLY 2 TIMES A WK  . flavoxATE (URISPAS) 100 MG tablet TK 1 T PO TID PRN  . HYDROcodone-acetaminophen (NORCO) 10-325 MG tablet Take 0.5 tablets by mouth 2 (two) times daily.   . hyoscyamine (LEVSIN SL) 0.125 MG SL tablet Place 1 tablet (0.125 mg total) under the tongue every 6 (six) hours as needed.  . pentosan polysulfate (ELMIRON) 100 MG capsule Take 100 mg by mouth 3 (three) times daily.   Marland Kitchen. saccharomyces boulardii (FLORASTOR) 250 MG capsule Take 250 mg by mouth 2 (two) times daily.  Marland Kitchen. zolpidem (AMBIEN) 10 MG tablet Take 0.5-1 tablets by mouth at bedtime.    No facility-administered encounter medications on file as of 08/21/2016.     Allergies as of 08/21/2016 - Review Complete 08/21/2016  Allergen Reaction Noted  . Iodinated diagnostic agents Shortness Of Breath 09/14/2014  . Erythromycin Nausea And Vomiting 03/12/2007  . Moxifloxacin Nausea And Vomiting 03/12/2007    Past Medical History:  Diagnosis Date  . Anxiety   . Arthritis   . Barrett's esophagus   . Cervical pseudoarthrosis (HCC)   . Chronic back pain   . Chronic bladder pain   .  Depression   . Gastritis   . GERD (gastroesophageal reflux disease)   . History of Clostridium difficile infection    recurrent  . History of condyloma acuminatum    vulvar 2000  . History of kidney stones 2012  . IC (interstitial cystitis)   . Insomnia   . Postlaminectomy syndrome of cervical region   . Sensation of pressure in bladder area   . Wears glasses     Past Surgical History:  Procedure Laterality Date  . ANTERIOR CERVICAL DECOMP/DISCECTOMY FUSION  2015   C5 -- C7  . BREAST ENHANCEMENT SURGERY Bilateral 2010  .  CYSTO WITH HYDRODISTENSION N/A 07/02/2016   Procedure: CYSTOSCOPY/HYDRODISTENSION;  Surgeon: Jethro Bolus, MD;  Location: [facility];  Service: Urology;  Laterality: N/A;  . DILATION AND CURETTAGE OF UTERUS     w/ suction for products of conception post partum  . DILITATION & CURRETTAGE/HYSTROSCOPY WITH NOVASURE ABLATION  04/04/2012   Procedure: DILATATION & CURETTAGE/HYSTEROSCOPY WITH NOVASURE ABLATION;  Surgeon: Lavina Hamman, MD;  Location: [facility];  Service: Gynecology;  Laterality: N/A;  . ESOPHAGOGASTRODUODENOSCOPY  last one 06-21-2016    Family History  Problem Relation Age of Onset  . Ovarian cancer Mother   . Testicular cancer Father   . Hypertension Father   . Cancer Maternal Grandmother        bone primary  . Cancer Paternal Grandmother        breast with lung mets  . Colon cancer Neg Hx   . Stomach cancer Neg Hx   . Esophageal cancer Neg Hx     Social History   Social History  . Marital status: Married    Spouse name: N/A  . Number of children: 2  . Years of education: N/A   Occupational History  . lawyer [employer]   Social History Main Topics  . Smoking status: Former Smoker    Packs/day: 0.75    Years: 7.00    Types: Cigarettes    Quit date: 04/03/1987  . Smokeless tobacco: Never Used  . Alcohol use 0.0 oz/week     Comment: socially,maybe weekly  . Drug use: No  . Sexual activity: Yes    Birth control/ protection: Pill   Other Topics Concern  . Not on file   Social History Narrative  . No narrative on file    Review of systems: Review of Systems  Constitutional: Negative for fever and chills.  HENT: Negative.   Eyes: Negative for blurred vision.  Respiratory: as per HPI  Cardiovascular: Negative for chest pain and palpitations.  Gastrointestinal: Negative for vomiting, diarrhea, blood per rectum. Genitourinary: Negative for dysuria, urgency, frequency and hematuria.  Musculoskeletal: Negative for myalgias, back pain and  joint pain.  Skin: Negative for itching and rash.  Neurological: Negative for dizziness, tremors, focal weakness, seizures and loss of consciousness.  Endo/Heme/Allergies: Negative for environmental allergies.  Psychiatric/Behavioral: Negative for depression, suicidal ideas and hallucinations.  All other systems reviewed and are negative.  Physical Exam: Blood pressure 118/72, pulse 68, height 5\' 7"  (1.702 m), weight 120 lb 3.2 oz (54.5 kg), SpO2 100 %. Gen:      No acute distress HEENT:  EOMI, sclera anicteric Neck:     No masses; no thyromegaly Lungs:    Clear to auscultation bilaterally; normal respiratory effort CV:         Regular rate and rhythm; no murmurs Abd:      + bowel sounds; soft, non-tender; no palpable masses,  no distension Ext:    No edema; adequate peripheral perfusion Skin:      Warm and dry; no rash Neuro: alert and oriented x 3 Psych: normal mood and affect  Data Reviewed: FENO 06/20/16- 7 FENO 08/21/16- 10  CXR 3/414/17-hyperinflation, no acute pulmonary abnormality CT abdomen 03/18/15-lung images do not show any pulmonary abnormality CT angiogram 08/10/16-no pulmonary medicine, no lung abnormalities. I reviewed all images personally.   PFTs. 27/18 FVC 3.56 (90%) FEV1 2.49 (79%) F/F 70 TLC 93% RV/TLC 135% DLCO 85% Minimal obstructive lung disease, no broncho dilator response  EKG 05/09/16- No acute changes, normal sinus rhythm  Assessment:  Evaluation for dyspnea, abnormal CXR She's had a pulmonary workup including PFTs which show very minimal obstruction, no rhonchi dilated.response and a CT with no evidence of pulmonary embolism or lung abnormalities. FENO continues to be low arguing against asthma. I suspect her dyspnea may be related to her barretts, acid reflux with component of anxiety.  She'll continue on the albuterol rescue inhaler, PPI. If the symptoms worsen then we can consider getting cardiao pulmonary exercise  test.  Plan/Recommendations: - Continue Albuterol rescue inhaler.   Chilton Greathouse MD [facility] Pager [phone] 08/21/2016, 10:09 AM  CC: Copland, Gwenlyn Found, MD

## 2016-08-21 NOTE — Patient Instructions (Signed)
I reviewed your pulmonary function tests and CT scan with you today Continue using albuterol as needed next and please let us know if there is any worsening of symptoms otherwise I'll see you in 6 months

## 2016-09-14 ENCOUNTER — Other Ambulatory Visit: Payer: Self-pay | Admitting: Family Medicine

## 2016-09-14 DIAGNOSIS — F4323 Adjustment disorder with mixed anxiety and depressed mood: Secondary | ICD-10-CM

## 2016-10-05 ENCOUNTER — Telehealth: Payer: Self-pay | Admitting: Internal Medicine

## 2016-10-05 MED ORDER — SUCRALFATE 1 GM/10ML PO SUSP: 1.0000 g | Freq: Three times a day (TID) | ORAL | 1 refills | Status: DC

## 2016-10-05 NOTE — Telephone Encounter (Signed)
Pt aware, script sent to pharmacy. 

## 2016-10-05 NOTE — Telephone Encounter (Signed)
Pt states she is having a terrible time with reflux right now. Requesting a liquid medication she has in the past. Pt had carafate in the past. Please advise if ok to prescribe carafate.

## 2016-10-05 NOTE — Telephone Encounter (Signed)
Yes, ok to renew liquid carafate 1 g TIDAC and HS  She should let me know if not improving symptoms

## 2016-10-08 ENCOUNTER — Telehealth: Payer: Self-pay | Admitting: Internal Medicine

## 2016-10-08 NOTE — Telephone Encounter (Signed)
Spoke with pharmacy and pt was given a 10day supply on Friday. She states the directions were wrong. Discussed with pt that they were not wrong, she took it incorrectly. Per pharmacist pt will have to call and explain to them why she ran out early. Med is quite expensive and they will not refill it early without explanation. Pt wanted to know if we could just send in a new script, explained we could not that she will have to call the pharmacy.

## 2016-10-10 ENCOUNTER — Other Ambulatory Visit: Payer: Self-pay | Admitting: Family Medicine

## 2016-10-10 DIAGNOSIS — F4323 Adjustment disorder with mixed anxiety and depressed mood: Secondary | ICD-10-CM

## 2016-10-11 ENCOUNTER — Telehealth: Payer: Self-pay | Admitting: Internal Medicine

## 2016-10-11 NOTE — Telephone Encounter (Signed)
Pt requesting to schedule an appt with midlevel the week of 10/22/16. Discussed with pt that she needs to call back next week as that schedule is not out yet. Pt verbalized understanding.

## 2016-10-12 ENCOUNTER — Other Ambulatory Visit: Payer: Self-pay

## 2016-10-12 MED ORDER — ALBUTEROL SULFATE HFA 108 (90 BASE) MCG/ACT IN AERS: 2.0000 | INHALATION_SPRAY | Freq: Four times a day (QID) | RESPIRATORY_TRACT | 3 refills | Status: DC | PRN

## 2016-10-19 ENCOUNTER — Other Ambulatory Visit: Payer: Self-pay | Admitting: Internal Medicine

## 2016-10-24 ENCOUNTER — Other Ambulatory Visit: Payer: Self-pay | Admitting: Internal Medicine

## 2016-10-26 ENCOUNTER — Encounter: Payer: Self-pay | Admitting: Gastroenterology

## 2016-10-26 ENCOUNTER — Ambulatory Visit (INDEPENDENT_AMBULATORY_CARE_PROVIDER_SITE_OTHER): Payer: BLUE CROSS/BLUE SHIELD | Admitting: Gastroenterology

## 2016-10-26 VITALS — BP 108/76 | HR 76 | Ht 66.75 in | Wt 114.4 lb

## 2016-10-26 DIAGNOSIS — K219 Gastro-esophageal reflux disease without esophagitis: Secondary | ICD-10-CM | POA: Diagnosis not present

## 2016-10-26 DIAGNOSIS — R09A2 Foreign body sensation, throat: Secondary | ICD-10-CM

## 2016-10-26 DIAGNOSIS — F458 Other somatoform disorders: Secondary | ICD-10-CM | POA: Diagnosis not present

## 2016-10-26 DIAGNOSIS — R0789 Other chest pain: Secondary | ICD-10-CM | POA: Diagnosis not present

## 2016-10-26 DIAGNOSIS — R0989 Other specified symptoms and signs involving the circulatory and respiratory systems: Secondary | ICD-10-CM | POA: Insufficient documentation

## 2016-10-26 NOTE — Progress Notes (Addendum)
10/26/2016 Anne [name] [id number] [birth year]   HISTORY OF PRESENT ILLNESS:  This is a 51 year old female who is known to Dr. Rhea Belton for reflux and Barrett's esophagus. She had an EGD in March of this year that actually showed improvement in her Barrett's esophagus, with only short segment Barrett's seen on exam.  She presents to our office today with very dramatic complaints stating that she has extremely severe acid reflux that is "building up and going into my lungs and alveoli".  She says that this causes her to be short of breath. 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  She complains of a globus sensation and constant pain in the lower part of her chest. She says that it is not a squeezing or gripping sensation and that food is not actually getting stuck. She says that symptoms are much worse shortly after she tries to eat anything. She is currently on Dexilant 60 mg daily and has been taking Pepcid intermittently. Most recently she was called in a prescription of Carafate suspension, which she is using, but feels like it is not enough. She says that her symptoms are so severe that at times she feels like she is going to collapse or may have to go to the emergency department.  She says that she is just so exhausted from these issues that she cannot even interact for her husband or her children when she gets home from work in the evenings. She is afraid that she is going to lose her job because she has been unable to perform adequately due to her symptoms.   Past Medical History:  Diagnosis Date  . Anxiety   . Arthritis   . Barrett's esophagus   . Cervical pseudoarthrosis (HCC)   . Chronic back pain   . Chronic bladder pain   . Depression   . Gastritis   . GERD (gastroesophageal reflux disease)   . History of Clostridium difficile infection    recurrent  . History of condyloma  acuminatum    vulvar 2000  . History of kidney stones 2012  . IC (interstitial cystitis)   . Insomnia   . Postlaminectomy syndrome of cervical region   . Sensation of pressure in bladder area   . Wears glasses    Past Surgical History:  Procedure Laterality Date  . ANTERIOR CERVICAL DECOMP/DISCECTOMY FUSION  2015   C5 -- C7  . BREAST ENHANCEMENT SURGERY Bilateral 2010  . CYSTO WITH HYDRODISTENSION N/A 07/02/2016   Procedure: CYSTOSCOPY/HYDRODISTENSION;  Surgeon: Jethro Bolus, MD;  Location: [facility];  Service: Urology;  Laterality: N/A;  . DILATION AND CURETTAGE OF UTERUS     w/ suction for products of conception post partum  . DILITATION & CURRETTAGE/HYSTROSCOPY WITH NOVASURE ABLATION  04/04/2012   Procedure: DILATATION & CURETTAGE/HYSTEROSCOPY WITH NOVASURE ABLATION;  Surgeon: Lavina Hamman, MD;  Location: [facility];  Service: Gynecology;  Laterality: N/A;  . ESOPHAGOGASTRODUODENOSCOPY  last one 06-21-2016    reports that she quit smoking about 29 years ago. Her smoking use included Cigarettes. She has a 5.25 pack-year smoking history. She has never used smokeless tobacco. She reports that she drinks alcohol. She reports that she does not use drugs. family history includes Cancer in her maternal grandmother and paternal grandmother; Hypertension in her father; Ovarian cancer in her mother; Testicular cancer in her father. Allergies  Allergen Reactions  . Iodinated Diagnostic Agents Shortness Of Breath    Respiratory distress   . Erythromycin Nausea And Vomiting       . Moxifloxacin Nausea And Vomiting           Outpatient Encounter Prescriptions as of 10/26/2016  Medication Sig  . ALPRAZolam (XANAX) 0.5 MG tablet Take 0.5 mg by mouth as needed.  . busPIRone (BUSPAR) 7.5 MG tablet TAKE 1-2 TABLETS TWICE DAILY AS NEEDED FOR ANXIETY  . CARAFATE 1 GM/10ML suspension TAKE 10 ML BY MOUTH FOUR TIMES DAILY WITH MEALS AND AT BEDTIME  . CYCLAFEM 1/35 tablet Take 1  tablet by mouth every evening.   Marland Kitchen. DEXILANT 60 MG capsule TAKE 1 CAPSULE DAILY BEFOREBREAKFAST  . estradiol (ESTRACE) 0.1 MG/GM vaginal cream I 1 GRAM VAGINALLY 2 TIMES A WK  . famotidine (PEPCID) 40 MG tablet Take 1 tablet by mouth as needed.  . flavoxATE (URISPAS) 100 MG tablet TK 1 T PO TID PRN  . pentosan polysulfate (ELMIRON) 100 MG capsule Take 100 mg by mouth 3 (three) times daily.   Marland Kitchen. saccharomyces boulardii (FLORASTOR) 250 MG capsule Take 250 mg by mouth 2 (two) times daily.  . tapentadol (NUCYNTA ER) 100 MG 12 hr tablet Take 100 mg by mouth every 8 (eight) hours.  Marland Kitchen. zolpidem (AMBIEN) 10 MG tablet Take 0.5-1 tablets by mouth at bedtime.   . [DISCONTINUED] albuterol (PROAIR HFA) 108 (90 Base) MCG/ACT inhaler Inhale 2 puffs into the lungs every 6 (six) hours as needed for wheezing or shortness of breath.  . [DISCONTINUED] albuterol (PROVENTIL HFA;VENTOLIN HFA) 108 (90 Base) MCG/ACT inhaler Inhale 2 puffs into the lungs every 6 (six) hours as needed for wheezing or shortness of breath.  . [DISCONTINUED] busPIRone (BUSPAR) 7.5 MG tablet TAKE 1 TO 2 TABLETS BY MOUTH TWICE DAILY AS NEEDED FOR ANXIETY  . [DISCONTINUED] HYDROcodone-acetaminophen (NORCO) 10-325 MG tablet Take 0.5 tablets by mouth 2 (two) times daily.   . [DISCONTINUED] hyoscyamine (LEVSIN SL) 0.125 MG SL tablet Place 1 tablet (0.125 mg total) under the tongue every 6 (six) hours as needed.   No facility-administered encounter medications on file as of 10/26/2016.      REVIEW OF SYSTEMS  : All other systems reviewed and negative except where noted in the History of Present Illness.   PHYSICAL EXAM: BP 108/76 (BP Location: Left Arm, Patient Position: Sitting, Cuff Size: Normal)   Pulse 76   Ht 5' 6.75" (1.695 m)   Wt 114 lb 6 oz (51.9 kg)   BMI 18.05 kg/m  General: Well developed white female in no acute distress Head: Normocephalic and atraumatic Eyes:  Sclerae anicteric, conjunctiva pink. Ears: Normal auditory  acuity Lungs: Clear throughout to auscultation; no increased WOB. Heart: Regular rate and rhythm; no M/R/G. Abdomen: Soft, non-distended.  BS present.  Mild epigastric TTP. Musculoskeletal: Symmetrical with no gross deformities  Skin: No lesions on visible extremities Extremities: No edema  Neurological: Alert oriented x 4, grossly non-focal Psychological:  Alert and cooperative. Normal mood and affect  ASSESSMENT AND PLAN: *51 year old female with complaints of severe GERD and chest pain as well as hoarseness but that does not appear to be present today.  She does obviously suffer from some reflux as she has Barrett's esophagus, but that actually has seemed to be improved on most recent endoscopy this year. Symptoms seem to be out of proportion to what was actually seen on that study. She is very desperate to find out what  is going on and get a resolution to her symptoms. I think that she will benefit from a 24-hour pH study while she is on her medication so that we can actually see if the medication is being effective. Will do esophageal manometry as well.  I am actually going to have her increase her Dexilant to twice a day for now as well and she can continue her Carafate and even add Pepcid at bedtime if needed.   CC:  Copland, Thierry Dobosz C, Gwenlyn FoundMD   Addendum: Reviewed and agree with plans for 24 hr pH and impedence testing. If using Dexilant BID, then it should be 30 mg BID, rather than 60 mg BID. Pyrtle, Carie CaddyJay M, MD

## 2016-10-26 NOTE — Patient Instructions (Signed)
You have been scheduled for an esophageal manometry at [facility] on 11/05/16 at 8 am. Please arrive 30 minutes prior to your procedure for registration. You will need to go to outpatient registration (1st floor of the hospital) first. Make certain to bring your insurance cards as well as a complete list of medications.  Please remember the following:   1) Do not take any muscle relaxants, xanax (alprazolam) or ativan for 1 day prior to your test as well as the day of the test.  2) Nothing to eat or drink after 12:00 midnight on the night before your test.  3) Hold all diabetic medications/insulin the morning of the test. You may eat and take your medications after the test.  It will take at least 2 weeks to receive the results of this test from your physician. ------------------------------------------ ABOUT ESOPHAGEAL MANOMETRY Esophageal manometry (muh-NOM-uh-tree) is a test that gauges how well your esophagus works. Your esophagus is the long, muscular tube that connects your throat to your stomach. Esophageal manometry measures the rhythmic muscle contractions (peristalsis) that occur in your esophagus when you swallow. Esophageal manometry also measures the coordination and force exerted by the muscles of your esophagus.  During esophageal manometry, a thin, flexible tube (catheter) that contains sensors is passed through your nose, down your esophagus and into your stomach. Esophageal manometry can be helpful in diagnosing some mostly uncommon disorders that affect your esophagus.  Why it's done Esophageal manometry is used to evaluate the movement (motility) of food through the esophagus and into the stomach. The test measures how well the circular bands of muscle (sphincters) at the top and bottom of your esophagus open and close, as well as the pressure, strength and pattern of the wave of esophageal muscle contractions that moves food along.  What you can expect Esophageal  manometry is an outpatient procedure done without sedation. Most people tolerate it well. You may be asked to change into a hospital gown before the test starts.  During esophageal manometry  While you are sitting up, a member of your health care team sprays your throat with a numbing medication or puts numbing gel in your nose or both.  A catheter is guided through your nose into your esophagus. The catheter may be sheathed in a water-filled sleeve. It doesn't interfere with your breathing. However, your eyes may water, and you may gag. You may have a slight nosebleed from irritation.  After the catheter is in place, you may be asked to lie on your back on an exam table, or you may be asked to remain seated.  You then swallow small sips of water. As you do, a computer connected to the catheter records the pressure, strength and pattern of your esophageal muscle contractions.  During the test, you'll be asked to breathe slowly and smoothly, remain as still as possible, and swallow only when you're asked to do so.  A member of your health care team may move the catheter down into your stomach while the catheter continues its measurements.  The catheter then is slowly withdrawn. The test usually lasts 20 to 30 minutes.  After esophageal manometry  When your esophageal manometry is complete, you may return to your normal activities  This test typically takes 30-45 minutes to complete. ________________________________________________________________________________  Bonita QuinYou have been scheduled for a 24 Hour PH Probe test at [facility] on 11/05/16 at 9 am. Please arrive 30 minutes prior to your procedure for registration. You will need to  go to outpatient registration (1st floor of the hospital) first. Make certain to bring your insurance cards as well as a complete list of medications.  ------------------------------------------ ABOUT 24 HOUR PH PROBE An esophageal pH test measures and records  the pH in your esophagus to determine if you have gastroesophageal reflux disease (GERD). The test can also be done to determine the effectiveness of medications or surgical treatment for GERD. What is esophageal reflux? Esophageal reflux is a condition in which stomach acid refluxes or moves back into the esophagus (the "food pipe" leading from the mouth to the stomach). How does the esophageal pH test work? A thin, small tube with an acid sensing device on the tip is gently passed through your nose, down the esophagus ("food tube"), and positioned about 2 inches above the lower esophageal sphincter. The tube is secured to the side of your face with clear tape. The end of the tube exiting from your nose is attached to a portable recorder that is worn on your belt or over your shoulder. The recorder has several buttons on it that you will press to mark certain events. A nurse will review the monitoring instructions with you. Once the test has begun, what do I need to know and do? Activity: Follow your usual daily routine. Do not reduce or change your activities during the monitoring period. Doing so can make the monitoring results less useful.  Note: do not take a tub bath or shower; the equipment can't get wet.  Eating: Eat your regular meals at the usual times. If you do not eat during the monitoring period, your stomach will not produce acid as usual, and the test results will not be accurate. Eat at least 2 meals a day. Eat foods that tend to increase your symptoms (without making yourself miserable). Avoid snacking. Do not suck on hard candy or lozenges and do not chew gum during the monitoring period.  Lying down: Remain upright throughout the day. Do not lie down until you go to bed (unless napping or lying down during the day is part of your daily routine).  Medications: Continue to follow your doctor's advice regarding medications to avoid during the monitoring period.  Recording symptoms: Press  the appropriate button on your recorder when symptoms occur (as discussed with the nurse).  Recording events: Record the time you start and stop eating and drinking (anything other than plain water). Record the time you lie down (even if just resting) and when you get back up. The nurse will explain this.  Unusual symptoms or side effects. If you think you may be experiencing any unusual symptoms or side effects, call your doctor.  You will return the next day to have the tube removed. The information on the recorder will be downloaded to a computer and the results will be analyzed.  After completion of the study Resume your normal diet and medications. Lozenges or hard candy may help ease any sore throat caused by the tube.

## 2016-10-30 ENCOUNTER — Telehealth: Payer: Self-pay | Admitting: Internal Medicine

## 2016-10-30 MED ORDER — SUCRALFATE 1 GM/10ML PO SUSP: ORAL | 0 refills | Status: DC

## 2016-10-30 NOTE — Telephone Encounter (Signed)
Dr Rhea BeltonPyrtle- Please see Jessica's recent note... Do you want patient to remain on Carafate for 3 months or do you want this to be more temporary? Patient is wanting 3 month rx to go to long term pharmacy.

## 2016-10-30 NOTE — Telephone Encounter (Signed)
Rx sent to [facility] as per patient request.

## 2016-10-30 NOTE — Telephone Encounter (Signed)
Ok for the next 3 months

## 2016-11-05 ENCOUNTER — Encounter (HOSPITAL_COMMUNITY)
Admission: RE | Disposition: A | Discharge status: Home / Self Care | Payer: Self-pay | Source: Ambulatory Visit | Attending: Gastroenterology

## 2016-11-05 ENCOUNTER — Ambulatory Visit (HOSPITAL_COMMUNITY)
Admission: RE | Admit: 2016-11-05 | Discharge: 2016-11-05 | Disposition: A | Discharge status: Home / Self Care | Payer: BLUE CROSS/BLUE SHIELD | Source: Ambulatory Visit | Attending: Gastroenterology | Admitting: Gastroenterology

## 2016-11-05 DIAGNOSIS — F458 Other somatoform disorders: Secondary | ICD-10-CM | POA: Diagnosis not present

## 2016-11-05 DIAGNOSIS — R490 Dysphonia: Secondary | ICD-10-CM | POA: Diagnosis not present

## 2016-11-05 DIAGNOSIS — R0789 Other chest pain: Secondary | ICD-10-CM

## 2016-11-05 DIAGNOSIS — K219 Gastro-esophageal reflux disease without esophagitis: Secondary | ICD-10-CM | POA: Insufficient documentation

## 2016-11-05 HISTORY — PX: ESOPHAGEAL MANOMETRY: SHX5429

## 2016-11-05 HISTORY — PX: 24 HOUR PH STUDY: SHX5419

## 2016-11-05 SURGERY — MANOMETRY, ESOPHAGUS

## 2016-11-05 MED ORDER — LIDOCAINE VISCOUS 2 % MT SOLN
OROMUCOSAL | Status: AC
  Filled 2016-11-05: qty 15

## 2016-11-05 SURGICAL SUPPLY — 2 items
FACESHIELD LNG OPTICON STERILE (SAFETY) IMPLANT
GLOVE BIO SURGEON STRL SZ8 (GLOVE) ×6 IMPLANT

## 2016-11-05 NOTE — Progress Notes (Signed)
Esophageal manometry and 24 hr. Ph impedence study done per protocol.  Pt. Tolerated both procedures well, without complication.  Dr. Lavon PaganiniNandigam to be notified today.  Omelia BlackwaterShelby Noble Bodie, RN

## 2016-11-06 ENCOUNTER — Encounter (HOSPITAL_COMMUNITY): Payer: Self-pay | Admitting: Gastroenterology

## 2016-11-15 ENCOUNTER — Telehealth: Payer: Self-pay | Admitting: *Deleted

## 2016-11-15 NOTE — Telephone Encounter (Signed)
I have left a voicemail for patient to call back. Per Dr Rhea BeltonPyrtle, patient's esophageal manometry completed on 11/05/16 was normal.

## 2016-11-16 ENCOUNTER — Other Ambulatory Visit: Payer: Self-pay | Admitting: Family Medicine

## 2016-11-16 DIAGNOSIS — F4323 Adjustment disorder with mixed anxiety and depressed mood: Secondary | ICD-10-CM

## 2016-11-16 NOTE — Telephone Encounter (Signed)
Pt is requesting refill on buspirone 7.5mg  tabs, last ov 05/2016, per note you wanted Pt to return in 4 weeks for recheck. Please advise.

## 2016-11-16 NOTE — Telephone Encounter (Signed)
Patient has been advised of normal manometry results. She verbalizes understanding of this.

## 2016-12-04 ENCOUNTER — Telehealth: Payer: Self-pay | Admitting: Internal Medicine

## 2016-12-04 MED ORDER — DEXLANSOPRAZOLE 30 MG PO CPDR: 30.0000 mg | DELAYED_RELEASE_CAPSULE | Freq: Two times a day (BID) | ORAL | 1 refills | Status: DC

## 2016-12-04 NOTE — Telephone Encounter (Signed)
I have left a voicemail for patient to call back. Per Dr Lauro FranklinPyrtle's addendum to Victorino DikeJennifer Lemmon's last office note 10/26/16, if patient is taking Dexilant twice daily, it should only be Dexilant 30 mg twice daily rather than 60 mg twice daily.

## 2016-12-04 NOTE — Telephone Encounter (Signed)
Patient advised that Dr Rhea BeltonPyrtle would like her to take only Dexilant 30 mg twice daily rather than Dexilant 60 mg twice daily. She is agreeable to this plan. I have sent a new prescription to her pharmacy. She verbalizes understanding.

## 2016-12-11 ENCOUNTER — Other Ambulatory Visit: Payer: Self-pay | Admitting: Family Medicine

## 2016-12-11 DIAGNOSIS — F4323 Adjustment disorder with mixed anxiety and depressed mood: Secondary | ICD-10-CM

## 2016-12-11 DIAGNOSIS — R490 Dysphonia: Secondary | ICD-10-CM

## 2016-12-12 ENCOUNTER — Telehealth: Payer: Self-pay

## 2016-12-12 NOTE — Telephone Encounter (Signed)
-----   Message from Beverley FiedlerJay M Pyrtle, MD sent at 12/11/2016  5:28 PM EDT ----- PH study results sent to you by email Please notify pt Hospital will scan into Epic Thanks JMP

## 2016-12-12 NOTE — Telephone Encounter (Signed)
Result letter mailed to pt.

## 2016-12-25 ENCOUNTER — Telehealth: Payer: Self-pay | Admitting: Internal Medicine

## 2016-12-25 NOTE — Telephone Encounter (Signed)
Pt states the lower dose of dexilant (  BID) did not work for her. States her reflux has gotten much worse. Reports she had acid coming up her throat into her mouth along with lung and chest pain. Reports she started taking Dexilant  BID last week and it has helped slightly. States that at times is it difficult to speak and she is losing her voice. Had no trouble hearing her on the phone. Please advise.

## 2016-12-25 NOTE — Telephone Encounter (Signed)
After extensive evaluation, including previous EGD and recent mano and 24 hr ph and impedence, I recommend referral to [facility] esophageal diseases clinic Her reflux was under control at the time of her pH study Would seek second opinion given persistent symptoms In the meantime, can return to Dexilant 60 mg daily and ranitidine 300 mg every evening or at bedtime

## 2016-12-25 NOTE — Telephone Encounter (Signed)
Pt aware. Records faxed to [facility] for second opinion.

## 2016-12-27 ENCOUNTER — Telehealth: Payer: Self-pay

## 2016-12-27 NOTE — Telephone Encounter (Signed)
Returned call again, left another detailed message regarding referral to [facility], see phone note from 12/25/16.

## 2016-12-28 NOTE — Telephone Encounter (Signed)
Patient returned phone call. °

## 2016-12-28 NOTE — Telephone Encounter (Signed)
Spoke with the Anne [name]. She has an appointment with [facility] 03/04/17. She has increased Dexilant 60 mg to BID with Ranitidine 300 mg every pm. She states though her symptoms are not gone, she is able to function more comfortably when this dosing of her medication. This will require a new Rx and may require a PA from her insurance.  Is it okay to send a new prescription to her mail order pharmacy for Dexilant 60 gm BID?

## 2016-12-30 NOTE — Telephone Encounter (Signed)
Ok just until [facility] appt I am uncomfortable with this dose long term

## 2016-12-31 ENCOUNTER — Other Ambulatory Visit: Payer: Self-pay

## 2016-12-31 MED ORDER — DEXLANSOPRAZOLE 60 MG PO CPDR: 60.0000 mg | DELAYED_RELEASE_CAPSULE | Freq: Two times a day (BID) | ORAL | 2 refills | Status: DC

## 2016-12-31 NOTE — Telephone Encounter (Signed)
Script sent to pharmacy.

## 2017-01-06 ENCOUNTER — Telehealth: Payer: Self-pay | Admitting: Family Medicine

## 2017-01-06 DIAGNOSIS — F4323 Adjustment disorder with mixed anxiety and depressed mood: Secondary | ICD-10-CM

## 2017-01-07 ENCOUNTER — Telehealth: Payer: Self-pay | Admitting: Pulmonary Disease

## 2017-01-07 MED ORDER — BUDESONIDE-FORMOTEROL FUMARATE 80-4.5 MCG/ACT IN AERO: 2.0000 | INHALATION_SPRAY | Freq: Two times a day (BID) | RESPIRATORY_TRACT | 2 refills | Status: DC

## 2017-01-07 NOTE — Telephone Encounter (Signed)
Go ahead and give her a sample of Symbicort 80/4.5 & instruct her on proper oral hygiene & technique. Have her do 2 puffs twice daily and call our office if this seems to help. Dr. Isaiah Serge can decide whether he wants to continue the medication at that point.

## 2017-01-07 NOTE — Telephone Encounter (Signed)
I think it is Ok for her to try using the albuterol 2 puffs as needed for shortness of breath, then follow with Dr Isaiah Serge to discuss whether this has been beneficial

## 2017-01-07 NOTE — Telephone Encounter (Signed)
Spoke with patient. She stated that she has been having some chest tightness off and on since January 2018. She stated that PM advised her to just use the albuterol HFA until she had a PFT. After doing the PFT, PM took her off of the albuterol completely.   She denied any coughing. Does have occassional SOB episodes. She wants to know if she can be put back on an inhaler to help with these symptoms.   She also wanted to see if she had an appt with PM anytime soon. Advised her she did not. She has been scheduled with PM for 02/18/17 at 945. She verbalized understanding.   Patient wishes to use [facility] in [place].   RB, please advise since PM is not in the office today. Thanks!

## 2017-01-07 NOTE — Telephone Encounter (Signed)
Spoke with patient. She is aware of JN's recs. Offered sample of Symbicort 80, pt declined due to being in [place]. Will go ahead and send in Symbicort to [facility] in [place].   Nothing else needed at time of call.

## 2017-01-07 NOTE — Telephone Encounter (Signed)
Pt states she has tried using albuterol 2 puffs prn with no benefit. Pt is requesting to be put back on an inhaler with a steroid due to occ episodes of sob.  JN please advise, as PM is on vacation. Thanks.

## 2017-01-07 NOTE — Telephone Encounter (Signed)
Will await JN's response before I call her back.

## 2017-01-07 NOTE — Telephone Encounter (Signed)
Patient is returning phone call. Patient request to speak with nurse further. She couldn't hear the message left on voicemail.

## 2017-01-10 NOTE — Telephone Encounter (Addendum)
Relation to pt: self  Call back number:[phone] Pharmacy: [id number] - [place] Levan, [place] - [address] AT [facility] [address] [phone] (Phone) [phone] (Fax)     Reason for call:  Patient was informed to call office and speak with PCP regarding busPIRone (BUSPAR) 7.5 MG tablet, please advise

## 2017-01-22 ENCOUNTER — Other Ambulatory Visit: Payer: Self-pay | Admitting: Family Medicine

## 2017-01-22 DIAGNOSIS — F4323 Adjustment disorder with mixed anxiety and depressed mood: Secondary | ICD-10-CM

## 2017-02-18 ENCOUNTER — Other Ambulatory Visit (INDEPENDENT_AMBULATORY_CARE_PROVIDER_SITE_OTHER): Payer: BLUE CROSS/BLUE SHIELD

## 2017-02-18 ENCOUNTER — Encounter: Payer: Self-pay | Admitting: Pulmonary Disease

## 2017-02-18 ENCOUNTER — Ambulatory Visit: Payer: BLUE CROSS/BLUE SHIELD | Admitting: Pulmonary Disease

## 2017-02-18 ENCOUNTER — Other Ambulatory Visit: Payer: Self-pay | Admitting: Family Medicine

## 2017-02-18 VITALS — BP 110/68 | HR 80 | Ht 67.0 in | Wt 121.6 lb

## 2017-02-18 DIAGNOSIS — F4323 Adjustment disorder with mixed anxiety and depressed mood: Secondary | ICD-10-CM

## 2017-02-18 DIAGNOSIS — R0602 Shortness of breath: Secondary | ICD-10-CM

## 2017-02-18 LAB — CBC WITH DIFFERENTIAL/PLATELET
BASOS PCT: 0.5 % (ref 0.0–3.0)
Basophils Absolute: 0 10*3/uL (ref 0.0–0.1)
EOS PCT: 0.7 % (ref 0.0–5.0)
Eosinophils Absolute: 0 10*3/uL (ref 0.0–0.7)
HEMATOCRIT: 37.8 % (ref 36.0–46.0)
HEMOGLOBIN: 12.6 g/dL (ref 12.0–15.0)
LYMPHS PCT: 23.8 % (ref 12.0–46.0)
Lymphs Abs: 1.5 10*3/uL (ref 0.7–4.0)
MCHC: 33.2 g/dL (ref 30.0–36.0)
MCV: 98 fl (ref 78.0–100.0)
MONOS PCT: 5.9 % (ref 3.0–12.0)
Monocytes Absolute: 0.4 10*3/uL (ref 0.1–1.0)
Neutro Abs: 4.2 10*3/uL (ref 1.4–7.7)
Neutrophils Relative %: 69.1 % (ref 43.0–77.0)
Platelets: 192 10*3/uL (ref 150.0–400.0)
RBC: 3.86 Mil/uL — AB (ref 3.87–5.11)
RDW: 12.6 % (ref 11.5–15.5)
WBC: 6.1 10*3/uL (ref 4.0–10.5)

## 2017-02-18 LAB — NITRIC OXIDE: NITRIC OXIDE: 12

## 2017-02-18 NOTE — Patient Instructions (Signed)
I am glad that your breathing is better after resumption of Symbicort Since the symptoms are stable we will not make any changes to your regimen We will check an FENO and CBC with differential and blood allergy profile for evaluation of allergies Please continue your antacid medication and [facility].  Return in 6 months

## 2017-02-18 NOTE — Progress Notes (Signed)
Anne FlavorsKristian N Poteat    [id number]    12/11/1965  Primary Care Physician:Copland, Gwenlyn FoundJessica C, MD  Referring Physician: Pearline Cablesopland, Jessica C, MD [address] [place], [place] [address]  Chief complaint:  Follow-up for evaluation of dyspnea.  HPI: [name] is a 51 Y/O with PMH of GERD, Barrett's esophagitis, gastritis, recurrent C. difficile. She was evaluated last week for worsening of her acid reflux with daily heartburn symptoms, chest discomfort for the past 3 months. Coincident with this she reports dyspnea on exertion. She denies any cough, sputum production, wheezing, hemoptysis. She reports using several pounds over the past few weeks.  She had a chest x-ray which showed hyperinflation and has been referred for further evaluation to the pulmonary office. She has minimal smoking history of about 3 pack years and quit in the 1980s. She works as a Advertising copywritertrial lawyer with no known exposures at work or at home. She has occasional seasonal allergies. She denies any postnasal drip.  She saw Dr. Sherene SiresWert on 5/10 after she had an acute episode of pain below the ribs, epigastric pain, dyspnea. She had a CT angiogram done the next day which did not show any pulmonary embolism or acute abnormality.   Interim History:  She has resumed her Symbicort since her last clinic visit.  Is using Symbicort every other day since she is afraid of side effects.  She is also using albuterol about once a day.  Reports that her breathing is much better since reinitiation of these inhalers.  She follows with Dr. Rhea BeltonPyrtle, GI for acid reflux and had an esophageal manometry which was normal.  She has been [facility] and has clinic visit scheduled for early December 2018.  Outpatient Encounter Medications as of 02/18/2017  Medication Sig  . ALPRAZolam (XANAX) 0.5 MG tablet Take 0.5 mg by mouth as needed.  . budesonide-formoterol (SYMBICORT) 80-4.5 MCG/ACT inhaler Inhale 2 puffs into  the lungs 2 (two) times daily. Rinse mouth after each use.  . busPIRone (BUSPAR) 7.5 MG tablet TAKE 1-2 TABLETS TWICE DAILY AS NEEDED FOR ANXIETY  . cimetidine (TAGAMET) 200 MG tablet Take 200 mg 3 (three) times daily by mouth.  [name]. CYCLAFEM 1/35 tablet Take 1 tablet by mouth every evening.   Marland Kitchen. estradiol (ESTRACE) 0.1 MG/GM vaginal cream I 1 GRAM VAGINALLY 2 TIMES A WK  . famotidine (PEPCID) 40 MG tablet Take 1 tablet by mouth as needed.  . saccharomyces boulardii (FLORASTOR) 250 MG capsule Take 250 mg by mouth 2 (two) times daily.  . sucralfate (CARAFATE) 1 GM/10ML suspension TAKE 10 ML BY MOUTH FOUR TIMES DAILY WITH MEALS AND AT BEDTIME  . zolpidem (AMBIEN) 10 MG tablet Take 0.5-1 tablets by mouth at bedtime.   . [DISCONTINUED] Dexlansoprazole (DEXILANT) 30 MG capsule Take 1 capsule (30 mg total) by mouth 2 (two) times daily.  . [DISCONTINUED] dexlansoprazole (DEXILANT) 60 MG capsule Take 1 capsule (60 mg total) by mouth 2 (two) times daily.  . [DISCONTINUED] flavoxATE (URISPAS) 100 MG tablet TK 1 T PO TID PRN  . [DISCONTINUED] pentosan polysulfate (ELMIRON) 100 MG capsule Take 100 mg by mouth 3 (three) times daily.   . [DISCONTINUED] tapentadol (NUCYNTA ER) 100 MG 12 hr tablet Take 100 mg by mouth every 8 (eight) hours.   No facility-administered encounter medications on file as of 02/18/2017.     Allergies as of 02/18/2017 - Review Complete 02/18/2017  Allergen Reaction Noted  .  Iodinated diagnostic agents Shortness Of Breath 09/14/2014  . Erythromycin Nausea And Vomiting 03/12/2007  . Moxifloxacin Nausea And Vomiting 03/12/2007    Past Medical History:  Diagnosis Date  . Anxiety   . Arthritis   . Barrett's esophagus   . Cervical pseudoarthrosis (HCC)   . Chronic back pain   . Chronic bladder pain   . Depression   . Gastritis   . GERD (gastroesophageal reflux disease)   . History of Clostridium difficile infection    recurrent  . History of condyloma acuminatum    vulvar 2000   . History of kidney stones 2012  . IC (interstitial cystitis)   . Insomnia   . Postlaminectomy syndrome of cervical region   . Sensation of pressure in bladder area   . Wears glasses     Past Surgical History:  Procedure Laterality Date  . 24 HOUR PH STUDY N/A 11/05/2016   Performed by Napoleon FormNandigam, Kavitha V, MD [facility]  . ANTERIOR CERVICAL DECOMP/DISCECTOMY FUSION  2015   C5 -- C7  . BREAST ENHANCEMENT SURGERY Bilateral 2010  . CYSTOSCOPY/HYDRODISTENSION N/A 07/02/2016   Performed by Jethro Bolusannenbaum, Sigmund, MD at [facility] [place]  . DILATATION & CURETTAGE/HYSTEROSCOPY WITH NOVASURE ABLATION N/A 04/04/2012   Performed by Lavina HammanMeisinger, Todd, MD [facility]  . DILATION AND CURETTAGE OF UTERUS     w/ suction for products of conception post partum  . ESOPHAGEAL MANOMETRY (EM) N/A 11/05/2016   Performed by Napoleon FormNandigam, Kavitha V, MD [facility]  . ESOPHAGOGASTRODUODENOSCOPY  last one 06-21-2016    Family History  Problem Relation Age of Onset  . Ovarian cancer Mother   . Testicular cancer Father   . Hypertension Father   . Cancer Maternal Grandmother        bone primary  . Cancer Paternal Grandmother        breast with lung mets  . Colon cancer Neg Hx   . Stomach cancer Neg Hx   . Esophageal cancer Neg Hx     Social History   Socioeconomic History  . Marital status: Married    Spouse name: Not on file  . Number of children: 2  . Years of education: Not on file  . Highest education level: Not on file  Social Needs  . Financial resource strain: Not on file  . Food insecurity - worry: Not on file  . Food insecurity - inability: Not on file  . Transportation needs - medical: Not on file  . Transportation needs - non-medical: Not on file  Occupational History  . Occupation: Scientist, research (medical)lawyer    Employer: [employer]  Tobacco Use  . Smoking status: Former Smoker    Packs/day: 0.75    Years: 7.00    Pack years: 5.25    Types: Cigarettes    Last attempt to quit: 04/03/1987     Years since quitting: 29.9  . Smokeless tobacco: Never Used  Substance and Sexual Activity  . Alcohol use: Yes    Alcohol/week: 0.0 oz    Comment: socially,maybe weekly  . Drug use: No  . Sexual activity: Yes    Birth control/protection: Pill  Other Topics Concern  . Not on file  Social History Narrative  . Not on file    Review of systems: Review of Systems  Constitutional: Negative for fever and chills.  HENT: Negative.   Eyes: Negative for blurred vision.  Respiratory: as per HPI  Cardiovascular: Negative for chest pain and palpitations.  Gastrointestinal: Negative for vomiting, diarrhea, blood per rectum. Genitourinary: Negative for dysuria, urgency, frequency and hematuria.  Musculoskeletal: Negative for myalgias, back pain and joint pain.  Skin: Negative for itching and rash.  Neurological: Negative for dizziness, tremors, focal weakness, seizures and loss of consciousness.  Endo/Heme/Allergies: Negative for environmental allergies.  Psychiatric/Behavioral: Negative for depression, suicidal ideas and hallucinations.  All other systems reviewed and are negative.  Physical Exam: Blood pressure 110/68, pulse 80, height 5\' 7"  (1.702 m), weight 55.2 kg (121 lb 9.6 oz), SpO2 98 %. Gen:      No acute distress HEENT:  EOMI, sclera anicteric Neck:     No masses; no thyromegaly Lungs:    Clear to auscultation bilaterally; normal respiratory effort CV:         Regular rate and rhythm; no murmurs Abd:      + bowel sounds; soft, non-tender; no palpable masses, no distension Ext:    No edema; adequate peripheral perfusion Skin:      Warm and dry; no rash Neuro: alert and oriented x 3 Psych: normal mood and affect  Data Reviewed: FENO 06/20/16- 7 FENO 08/21/16- 10  CXR 3/414/17-hyperinflation, no acute pulmonary abnormality CT abdomen 03/18/15-lung images do not show any pulmonary abnormality CT angiogram 08/10/16-no pulmonary embolism, no lung abnormalities. I reviewed all  images personally.   PFTs. 27/18 FVC 3.56 (90%), FEV1 2.49 (79%), F/F 70, TLC 93%, RV/TLC 135%, DLCO 85% Minimal obstructive lung disease, no broncho dilator response  EKG 05/09/16- No acute changes, normal sinus rhythm  Assessment:  Evaluation for dyspnea She's had a pulmonary workup including PFTs which show very minimal obstruction, no bronchodilator response and a CT with no evidence of pulmonary embolism or lung abnormalities. FENO continues to be low arguing against asthma. I suspect her dyspnea may be related to her barretts, acid reflux with component of anxiety.  Since symptomatically she is better on Symbicort will continue the same without any change.  Reassess CBC with differential and blood allergy profile today Recheck FENO She will continue on PPI and has an eval pending at [facility] for acid reflux  Plan/Recommendations: - Continue Albuterol rescue inhaler Symbicort - Recheck FENO, CBC with diff and blood allergy profile  Chilton Greathouse MD [facility] Pager [phone] 02/18/2017, 10:15 AM  CC: Copland, Gwenlyn Found, MD

## 2017-02-19 LAB — RESPIRATORY ALLERGY PROFILE REGION II ~~LOC~~
Allergen, A. alternata, m6: <0.1 kU/L
Allergen, Comm Silver Birch, t9: <0.1 kU/L
Allergen, Cottonwood, t14: <0.1 kU/L
Allergen, D pternoyssinus,d7: <0.1 kU/L
Allergen, P. notatum, m1: <0.1 kU/L
Bermuda Grass: <0.1 kU/L
Box Elder IgE: <0.1 kU/L
CLADOSPORIUM HERBARUM (M2) IGE: <0.1 kU/L
CLASS: 0
CLASS: 0
CLASS: 0
CLASS: 0
CLASS: 0
CLASS: 0
CLASS: 0
CLASS: 0
CLASS: 0
CLASS: 0
CLASS: 0
CLASS: 0
CLASS: 0
CLASS: 0
COMMON RAGWEED (SHORT) (W1) IGE: <0.1 kU/L
Class: 0
Class: 0
Class: 0
Class: 0
Class: 0
Class: 0
Class: 0
Class: 0
Class: 0
Class: 0
D. farinae: <0.1 kU/L
Elm IgE: <0.1 kU/L
IgE (Immunoglobulin E), Serum: <2 kU/L (ref <=114)
Johnson Grass: <0.1 kU/L
Pecan/Hickory Tree IgE: <0.1 kU/L
Sheep Sorrel IgE: <0.1 kU/L

## 2017-02-19 LAB — INTERPRETATION:

## 2017-03-25 ENCOUNTER — Other Ambulatory Visit: Payer: Self-pay | Admitting: Family Medicine

## 2017-03-25 DIAGNOSIS — F4323 Adjustment disorder with mixed anxiety and depressed mood: Secondary | ICD-10-CM

## 2017-05-14 NOTE — Progress Notes (Signed)
[facility] [address], [address] [place], [place] [address] [phone]  Date:  05/15/2017   Name:  Anne [name]   DOB:  1965/12/08   MRN:  [id number]  PCP:  Pearline Cables, MD    Chief Complaint: No chief complaint on file.   History of Present Illness:  Anne [name] is a 52 y.o. very pleasant female patient who presents with the following:  I last saw her in the office about a year ago:  She has noted several panic attacks over the last week Would like to reduce her work schedule which I think is a good idea Anne [name] generally seems miserable and speaks very negatively about most aspects of her life.  Tried to discuss this with her, but she seems surprised by this observation and does not feel that she is depressed.  She is not willing to consider an SSRI at this time Suspect non- cardiac CP and her EKG is normal However counseled her that an EKG is not a definitive test for heart disease and offered to refer her to cardiology or do further testing here- she declines for now but will keep this in mind  I spent 40 minutes face to face with Anne [name] today. More than 50% in counseling  Asked her to please see me in 3-4 weeks for a rechck Please do NOT take more than 10 mg of ambien at night- this is dangerous to do.   I would strongly encourage you to see a counselor, and if you are willing a psychiatrist.  Please think hard about this- I think it might help you We will have you work a reduced schedule for the next couple of months to give yourself a break.  You can continue to take xanax twice a day as needed- however please remember that this medication can be habit forming and sedating. It should NOT be combined with any other sedating medication  She is also seeing GYN for pain with intercourse, urology for interstitial cystitis, pulmonology for SOB and GI for chronic GERD  Mammo:  Will have done this year   Pap: per her GYN Flu: done  She is interested in taking wellbutrin- she has not used this in the past, but is currently facing a family health crisis- her son has cancer- and feels like she needs something to help her She suffers from both depression and anxiety- she feels like the depression is worse.  She is having a hard time functioning. She is taking some time off work to care for her son- this is helping her a lot.    Her 60 yo son has recently been dx with cancer- some sort of rare testicular cancer.  He is being treated at [facility], he had undergone surgery x3 and is doing chemo.  Radiation is not thought to be needed for him.    A long time ago she took zoloft, and celexa she thinks. She does not quite remember however, and did not think that they helped her that much She is still taking buspar BID She does not have any intent of self harm  Patient Active Problem List   Diagnosis Date Noted  . Hoarseness   . Gastroesophageal reflux disease 10/26/2016  . Globus sensation 10/26/2016  . Other chest pain 10/26/2016  . Dyspnea on exertion 08/09/2016  . Cervical post-laminectomy syndrome 04/25/2016  . H/O Clostridium difficile infection 08/11/2015  . Barrett's esophagus  08/18/2013  . Scoliosis of lumbosacral spine 05/04/2013  . Menorrhagia 04/04/2012  . Eustachian tube dysfunction 01/10/2012  . DEPRESSIVE DISORDER 06/01/2009  . CEPHALGIA 08/27/2008    Past Medical History:  Diagnosis Date  . Anxiety   . Arthritis   . Barrett's esophagus   . Cervical pseudoarthrosis (HCC)   . Chronic back pain   . Chronic bladder pain   . Depression   . Gastritis   . GERD (gastroesophageal reflux disease)   . History of Clostridium difficile infection    recurrent  . History of condyloma acuminatum    vulvar 2000  . History of kidney stones 2012  . IC (interstitial cystitis)   . Insomnia   . Postlaminectomy syndrome of cervical region   . Sensation of pressure in bladder area   . Wears  glasses     Past Surgical History:  Procedure Laterality Date  . 24 HOUR PH STUDY N/A 11/05/2016   Procedure: 24 HOUR PH STUDY;  Surgeon: Napoleon FormNandigam, Kavitha V, MD;  Location: [facility];  Service: Endoscopy;  Laterality: N/A;  . ANTERIOR CERVICAL DECOMP/DISCECTOMY FUSION  2015   C5 -- C7  . BREAST ENHANCEMENT SURGERY Bilateral 2010  . CYSTO WITH HYDRODISTENSION N/A 07/02/2016   Procedure: CYSTOSCOPY/HYDRODISTENSION;  Surgeon: Jethro BolusSigmund Tannenbaum, MD;  [facility];  Service: Urology;  Laterality: N/A;  . DILATION AND CURETTAGE OF UTERUS     w/ suction for products of conception post partum  . DILITATION & CURRETTAGE/HYSTROSCOPY WITH NOVASURE ABLATION  04/04/2012   Procedure: DILATATION & CURETTAGE/HYSTEROSCOPY WITH NOVASURE ABLATION;  Surgeon: Lavina Hammanodd Meisinger, MD;  Location: [facility];  Service: Gynecology;  Laterality: N/A;  . ESOPHAGEAL MANOMETRY N/A 11/05/2016   Procedure: ESOPHAGEAL MANOMETRY (EM);  Surgeon: Napoleon FormNandigam, Kavitha V, MD;  Location: [facility];  Service: Endoscopy;  Laterality: N/A;  . ESOPHAGOGASTRODUODENOSCOPY  last one 06-21-2016    Social History   Tobacco Use  . Smoking status: Former Smoker    Packs/day: 0.75    Years: 7.00    Pack years: 5.25    Types: Cigarettes    Last attempt to quit: 04/03/1987    Years since quitting: 30.1  . Smokeless tobacco: Never Used  Substance Use Topics  . Alcohol use: Yes    Alcohol/week: 0.0 oz    Comment: socially,maybe weekly  . Drug use: No    Family History  Problem Relation Age of Onset  . Ovarian cancer Mother   . Testicular cancer Father   . Hypertension Father   . Cancer Maternal Grandmother        bone primary  . Cancer Paternal Grandmother        breast with lung mets  . Colon cancer Neg Hx   . Stomach cancer Neg Hx   . Esophageal cancer Neg Hx     Allergies  Allergen Reactions  . Iodinated Diagnostic Agents Shortness Of Breath    Respiratory distress   . Erythromycin Nausea And  Vomiting       . Moxifloxacin Nausea And Vomiting         Medication list has been reviewed and updated.  Current Outpatient Medications on File Prior to Visit  Medication Sig Dispense Refill  . ALPRAZolam (XANAX) 0.5 MG tablet Take 0.5 mg by mouth as needed.    . budesonide-formoterol (SYMBICORT) 80-4.5 MCG/ACT inhaler Inhale 2 puffs into the lungs 2 (two) times daily. Rinse mouth after each use. 1 Inhaler 2  . cimetidine (TAGAMET) 200 MG tablet Take  200 mg 3 (three) times daily by mouth.    [name] 1/35 tablet Take 1 tablet by mouth every evening.     Marland Kitchen estradiol (ESTRACE) 0.1 MG/GM vaginal cream I 1 GRAM VAGINALLY 2 TIMES A WK  12  . famotidine (PEPCID) 40 MG tablet Take 1 tablet by mouth as needed.    . saccharomyces boulardii (FLORASTOR) 250 MG capsule Take 250 mg by mouth 2 (two) times daily.    . sucralfate (CARAFATE) 1 GM/10ML suspension TAKE 10 ML BY MOUTH FOUR TIMES DAILY WITH MEALS AND AT BEDTIME 3600 mL 0  . zolpidem (AMBIEN) 10 MG tablet Take 0.5-1 tablets by mouth at bedtime.      No current facility-administered medications on file prior to visit.     Review of Systems:  As per HPI- otherwise negative.   Physical Examination: Vitals:   05/15/17 1444  BP: 114/75  Pulse: 76  Resp: 18  Temp: 98.1 F (36.7 C)  SpO2: 98%   Vitals:   05/15/17 1444  Weight: 128 lb 9.6 oz (58.3 kg)  Height: 5' 7.5" (1.715 m)   Body mass index is 19.84 kg/m. Ideal Body Weight: Weight in (lb) to have BMI = 25: 161.7  GEN: WDWN, NAD, Non-toxic, A & O x 3, thin build, looks well physically but sad  HEENT: Atraumatic, Normocephalic. Neck supple. No masses, No LAD. Ears and Nose: No external deformity. CV: RRR, No M/G/R. No JVD. No thrill. No extra heart sounds. PULM: CTA B, no wheezes, crackles, rhonchi. No retractions. No resp. distress. No accessory muscle use.Marland Kitchen EXTR: No c/c/e NEURO Normal gait.  PSYCH: Normally interactive. Conversant.    Assessment and Plan: Acute  stress reaction - Plan: buPROPion (WELLBUTRIN SR) 150 MG 12 hr tablet  Adjustment disorder with mixed anxiety and depressed mood - Plan: busPIRone (BUSPAR) 7.5 MG tablet  Here today with acute stress/ depression/ anxiety; exacerbated by her son's illness Continue buspar Add wellbutrin 150 daily for 3 days, then BID  See patient instructions for more details.     Signed Abbe Amsterdam, MD

## 2017-05-15 ENCOUNTER — Ambulatory Visit: Payer: BLUE CROSS/BLUE SHIELD | Admitting: Family Medicine

## 2017-05-15 ENCOUNTER — Encounter: Payer: Self-pay | Admitting: Family Medicine

## 2017-05-15 VITALS — BP 114/75 | HR 76 | Temp 98.1°F | Resp 18 | Ht 67.5 in | Wt 128.6 lb

## 2017-05-15 DIAGNOSIS — F430 Acute stress reaction: Secondary | ICD-10-CM

## 2017-05-15 DIAGNOSIS — F4323 Adjustment disorder with mixed anxiety and depressed mood: Secondary | ICD-10-CM | POA: Diagnosis not present

## 2017-05-15 MED ORDER — BUSPIRONE HCL 7.5 MG PO TABS: 7.5000 mg | ORAL_TABLET | Freq: Two times a day (BID) | ORAL | 6 refills | Status: DC | PRN

## 2017-05-15 MED ORDER — BUPROPION HCL ER (SR) 150 MG PO TB12: 150.0000 mg | ORAL_TABLET | Freq: Two times a day (BID) | ORAL | 6 refills | Status: DC

## 2017-05-15 NOTE — Patient Instructions (Signed)
I am so sorry to hear of you son's illness!  Please let me know if I can help you in any way We refilled your Buspar today Also started wellbutrin- 150 mg, take once a day for 3 days, then go to twice a day  Please email me with an update in about 2 weeks and let me know how you are doing- Sooner if worse.     [facility] cancer support groups Cancer Support Group (for patients) Occurs on the 3rd Thursday of every month, 5:30 pm - 7:00 pm  Located at the [facility] [address] [place], [place] [address]  Dinner is provided. Caregiver Support Group (for those coping with a loved one's cancer) Occurs on the 3rd Thursday of every month, 5:30 pm - 7:00 pm  Located at the [facility] [address] [place], [place] [address]  Dinner is provided. [facility] (for families coping with a parent's cancer) Please call [phone] to register  Occurs on the 2nd Monday of each month, 6:00 pm - 8:00 pm

## 2017-06-04 ENCOUNTER — Telehealth: Payer: Self-pay | Admitting: Internal Medicine

## 2017-06-04 NOTE — Telephone Encounter (Signed)
I am okay with famotidine 40 mg BID Cimetidine is in the same pharmacologic category, H2 blocker, and would not be expected to provide additional benefit over twice daily famotidine I do recommend [facility] as previously discussed

## 2017-06-04 NOTE — Telephone Encounter (Signed)
Dr Rhea BeltonPyrtle- Looks like this patient is requesting us to write scripts for pepcid 40 mg and tagamet 200 mg. However, it appears she was to be on Dexilant 60 mg once daily and ranitidine 300 mg every evening and was also to [facility]. I am unable to see that [facility]. Would you like me to sent scripts for requested medications or ask her to go [facility] as previous recommendations suggested?

## 2017-06-05 MED ORDER — FAMOTIDINE 40 MG PO TABS: 40.0000 mg | ORAL_TABLET | Freq: Two times a day (BID) | ORAL | 0 refills | Status: DC

## 2017-06-05 NOTE — Telephone Encounter (Addendum)
I have spoken to patient to give Dr Lauro FranklinPyrtle's thoughts and recommendations regarding pepcid and tagmet. Patient is adamant that she has researched and looked in medical journals to find that this combination of medications is what is recommended for her problem. She states that these medications work well for her. I advised that unfortunately, Dr Rhea BeltonPyrtle does not feel comfortable with her taking both medications at the same time, therefore he is not okay with being the prescribing provider for those medications. Patient also notes that she was scheduled on 2 diffferent occasions to see Dr Wilford CornerArora. However, her 53 year old son was recently diagnosed with a rare form of testicular cancer and she has been his primary care giver, [facility]. She will [facility] to [facility] when she is able. I have contacted [facility] and was told that her referral is good through September of 2019.

## 2017-06-05 NOTE — Telephone Encounter (Signed)
Left voicemail for patient to call back. 

## 2017-06-26 ENCOUNTER — Telehealth: Payer: Self-pay | Admitting: Pulmonary Disease

## 2017-07-03 NOTE — Telephone Encounter (Signed)
Error

## 2017-12-03 ENCOUNTER — Other Ambulatory Visit: Payer: Self-pay

## 2017-12-03 DIAGNOSIS — F430 Acute stress reaction: Secondary | ICD-10-CM

## 2017-12-03 MED ORDER — BUPROPION HCL ER (SR) 150 MG PO TB12: 150.0000 mg | ORAL_TABLET | Freq: Two times a day (BID) | ORAL | 0 refills | Status: DC

## 2017-12-03 NOTE — Telephone Encounter (Signed)
Received fax from Colleen Can at [facility] drug requesting refill on patient's wellbutrin. Patient's LOV 05/15/17. Refill has been sent for 30 day supply. Will need ov for further refills.

## 2018-05-24 IMAGING — DX DG CHEST 2V
2 series · 2 of 2 positions shown · non-contrast
Comparison: Chest x-ray of June 17, 2014.

CLINICAL DATA: Shortness of breath and mid chest pain for the past
3 months. History of gastroesophageal reflux and Barrett's
esophagus, previous episodes of pneumonia, nonsmoker.

EXAM:
CHEST  2 VIEW

[chest pa]
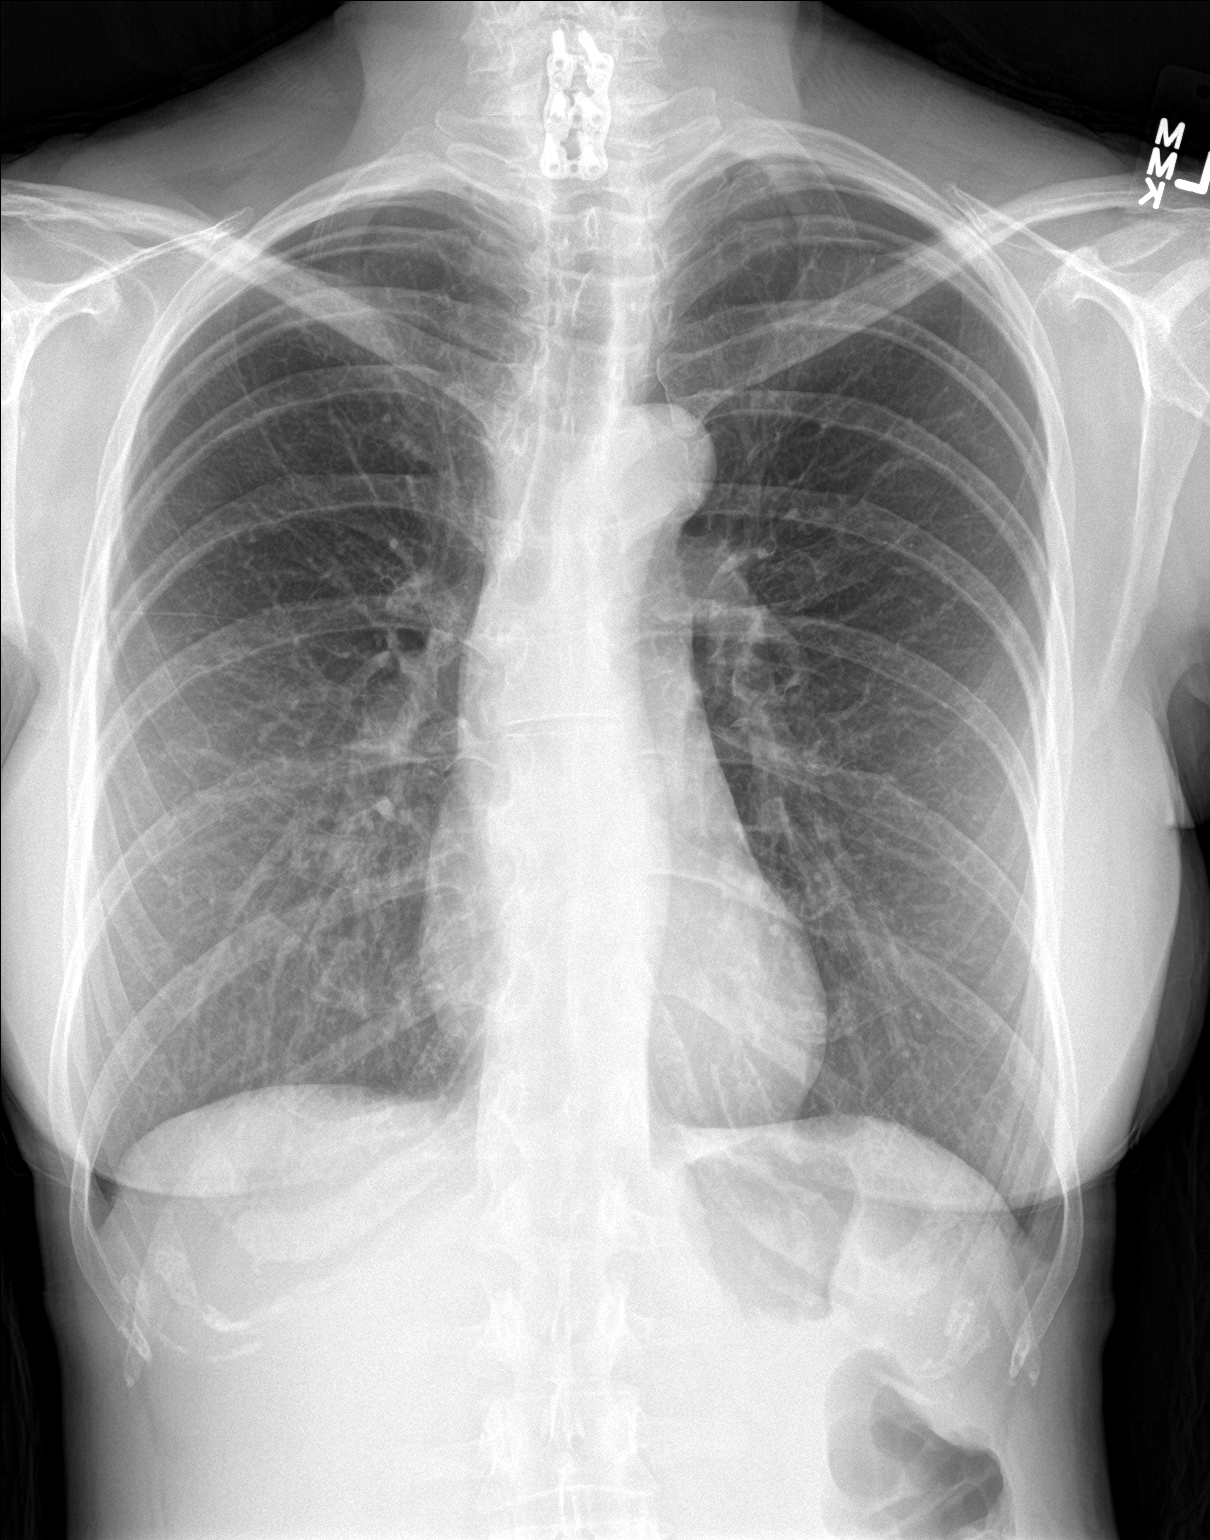

[chest lat]
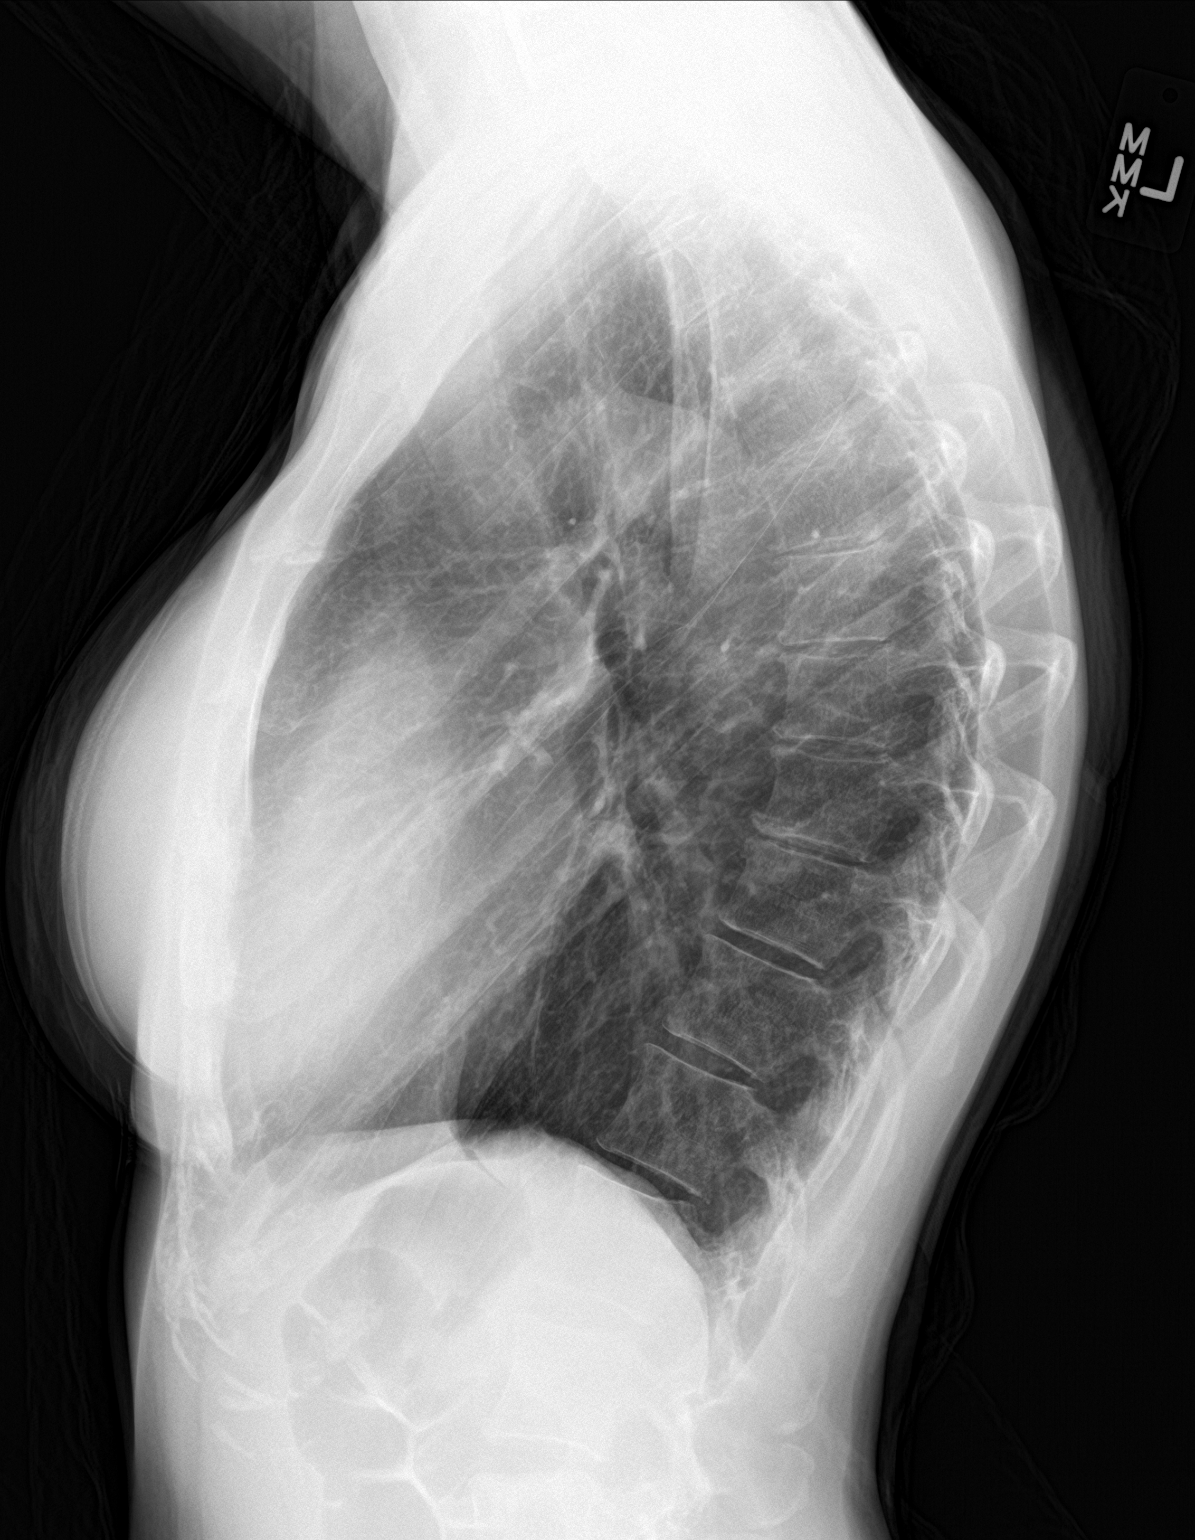

[2 of 2 positions shown; findings below may reference images not displayed]

FINDINGS: The lungs remain hyperinflated. There is no focal infiltrate. There
is no pleural effusion, pneumomediastinum, or pneumothorax. The
heart and pulmonary vascularity are normal. The mediastinum is
normal in width. The bony thorax exhibits no acute abnormality.
Bilateral breast implants are present.
IMPRESSION: Hyperinflation consistent with COPD. No pneumonia, CHF, nor other
acute cardiopulmonary abnormality.

## 2018-07-21 IMAGING — CT CT ANGIO CHEST
2 of 6 series · 19 of 46 positions shown · IV contrast (APPLIED)
Comparison: Two-view chest x-ray 06/13/2016

CLINICAL DATA: Dyspnea on exertion.  Shortness of breath.

EXAM:
CT ANGIOGRAPHY CHEST WITH CONTRAST
TECHNIQUE: Multidetector CT imaging of the chest was performed using the
standard protocol during bolus administration of intravenous
contrast. Multiplanar CT image reconstructions and MIPs were
obtained to evaluate the vascular anatomy.
CONTRAST:  100 mL Isovue 370

[Series 5: thins · axial · 0.58mm/px · z∈[-309,-23]mm · 17 of 314 slices shown]
[im 14/314  lung]
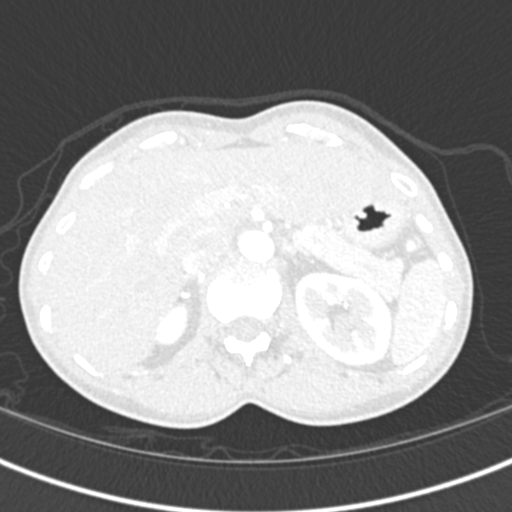
[im 28/314  soft-tissue]
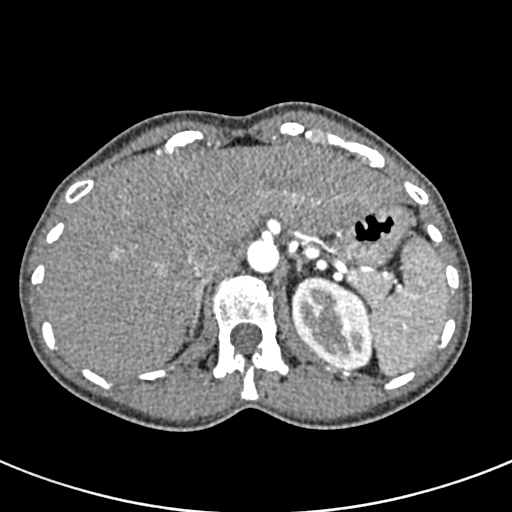
[im 55/314  lung]
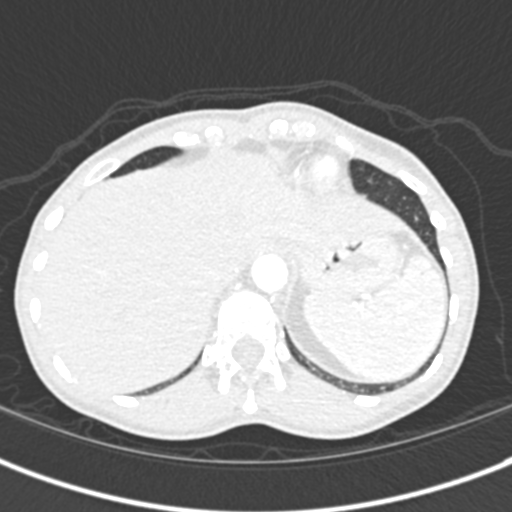
[im 69/314  soft-tissue]
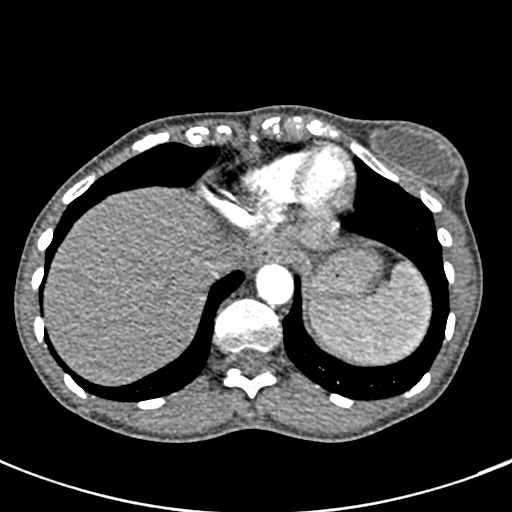
[im 82/314  lung]
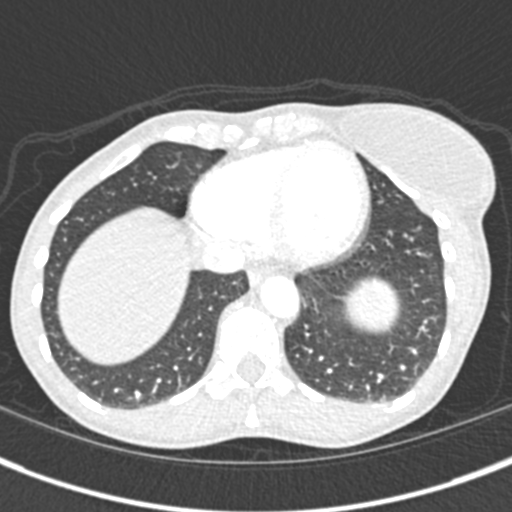
[im 109/314  soft-tissue]
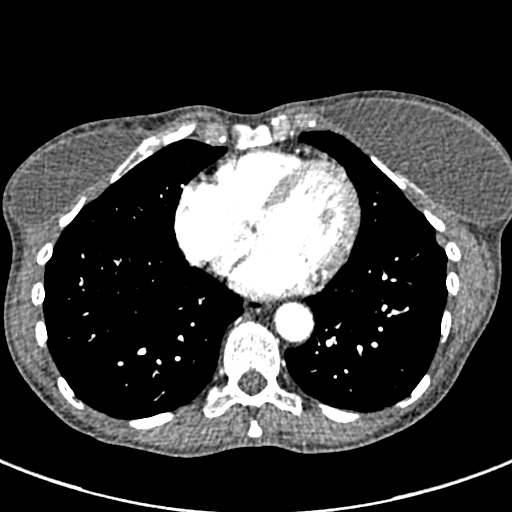
[im 123/314  lung]
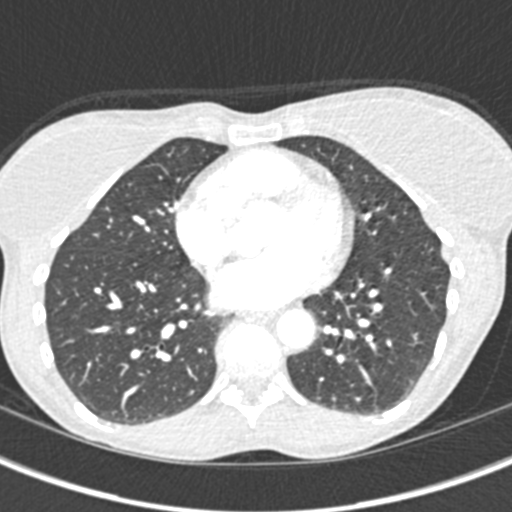
[im 137/314  soft-tissue]
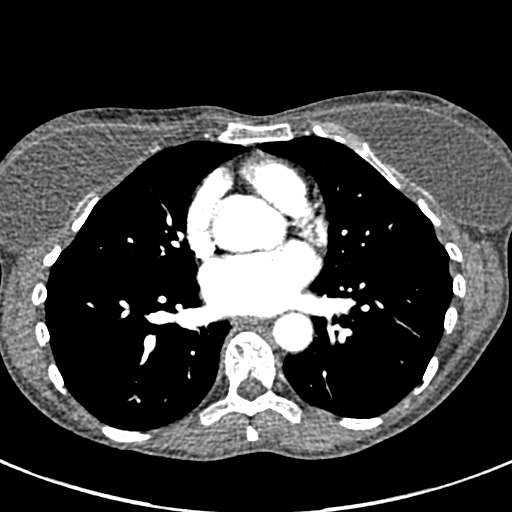
[im 164/314  lung]
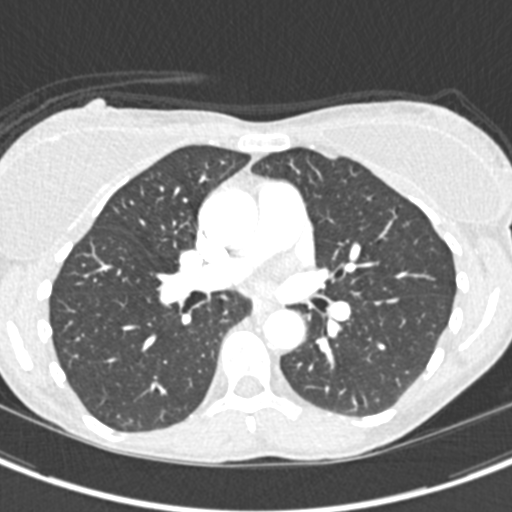
[im 177/314  soft-tissue]
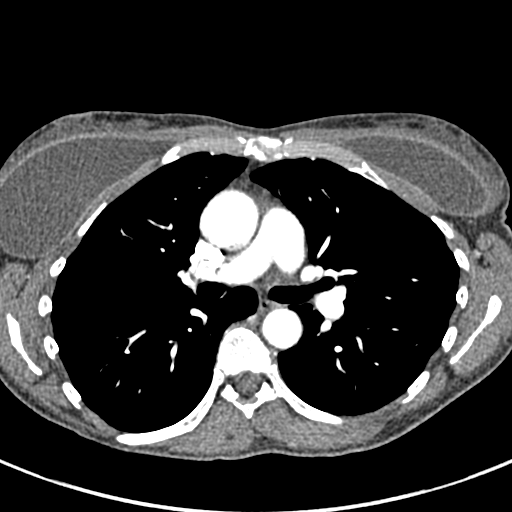
[im 191/314  lung]
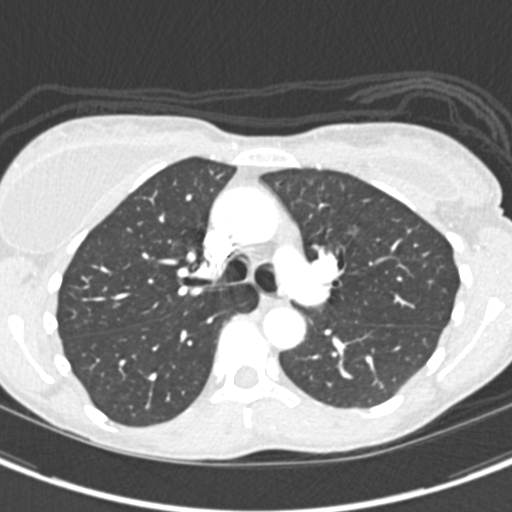
[im 205/314  soft-tissue]
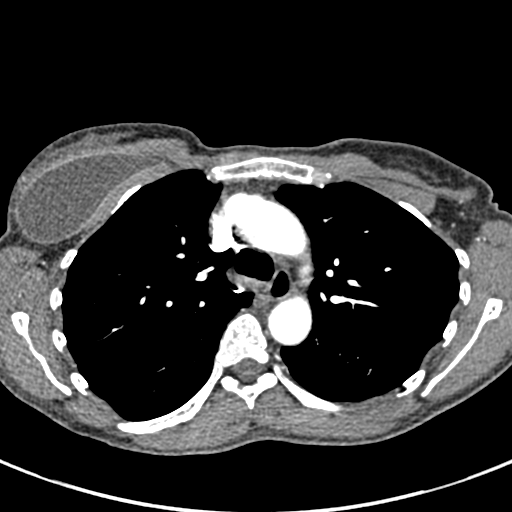
[im 232/314  lung]
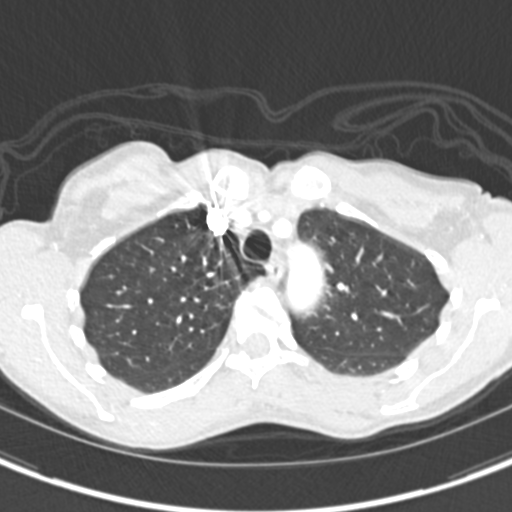
[im 245/314  soft-tissue]
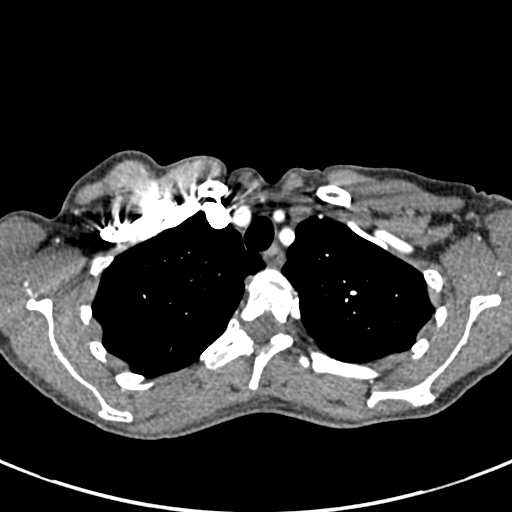
[im 259/314  lung]
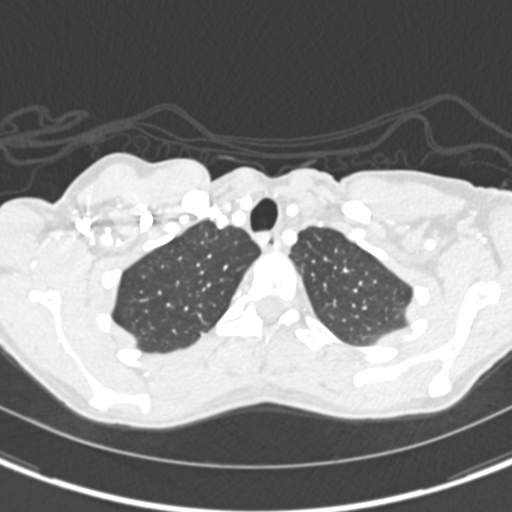
[im 286/314  soft-tissue]
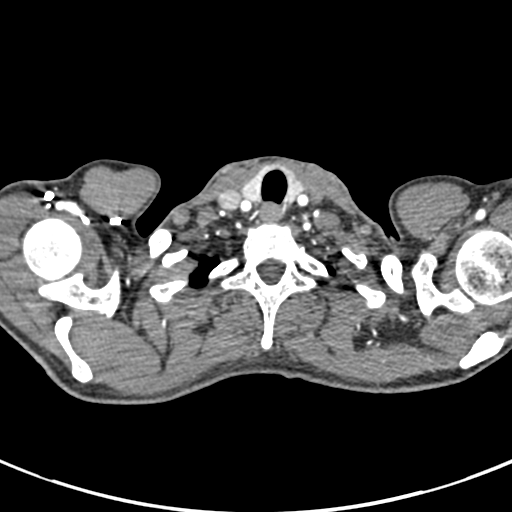
[im 300/314  lung]
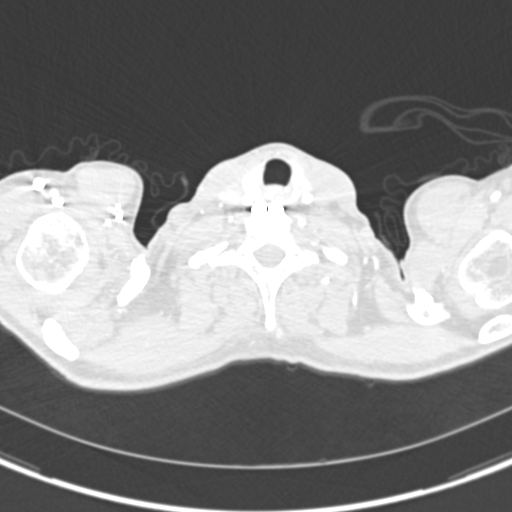

[Series 7: coronal mpr · coronal · 0.53mm/px · 2 of 69 slices shown]
[im 23/69  soft-tissue]
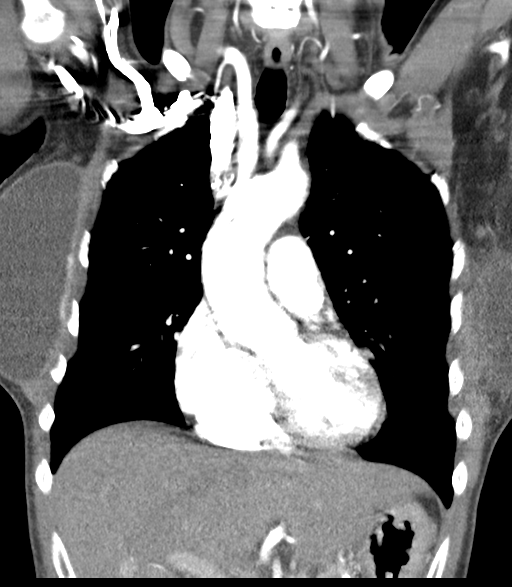
[im 46/69  soft-tissue]
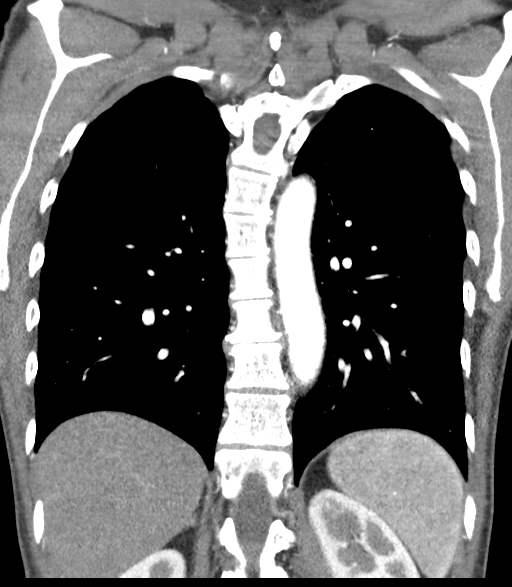

[19 of 46 positions shown; findings below may reference images not displayed]

FINDINGS: Cardiovascular: The heart size is normal. Aortic arch and great
vessels are within normal limits.

Pulmonary arterial opacification is satisfactory. There are no focal
filling defects to suggest pulmonary emboli.

Mediastinum/Nodes: No significant mediastinal or axillary adenopathy
is present.

Lungs/Pleura: The lungs are clear. No focal nodule or mass lesion is
present. There is no significant airspace disease.

Upper Abdomen: Within normal limits.

Musculoskeletal: Scoliosis is present in the thoracic spine, convex
to the right at C6-7 and to the left in the lower thoracic spine.
Vertebral body heights and AP alignment are maintained. Cervical
spine fusion is noted. Bilateral supra pectoral breast implants are
noted.

Review of the MIP images confirms the above findings.
IMPRESSION: 1. No pulmonary embolus.
2. No acute or focal airspace disease to explain the patient's
symptoms.
3. Scoliosis.

## 2018-10-13 ENCOUNTER — Telehealth: Payer: Self-pay | Admitting: *Deleted

## 2018-10-13 NOTE — Telephone Encounter (Signed)
Probably best for her to maintain 1 GI doctor/group for continuity of care, this helps her, PCP and her GI provider Repeat cologuard is recommended (versus standard colonoscopy for screening), thus I would ask her to discuss this with her GI provider at [facility]  If that provider advises her to contact me, then she should let me know  Thank you

## 2018-10-13 NOTE — Telephone Encounter (Signed)
Left message for patient to call back  

## 2018-10-13 NOTE — Telephone Encounter (Signed)
Patient had a negative cologuard 09/2015. Would you like her to repeat cologuard, complete colonoscopy or have her discuss in office? 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

## 2018-10-14 NOTE — Telephone Encounter (Signed)
Left message for patient to call back  

## 2018-10-16 NOTE — Telephone Encounter (Signed)
I have advised patient that she is due for cologuard and she should ask her GI provider at [facility] to order this. She verbalizes understanding of this and will let us know if there are any issues with ordering.

## 2018-12-17 ENCOUNTER — Telehealth: Payer: Self-pay

## 2018-12-17 NOTE — Telephone Encounter (Signed)
Recall Cologuard letter sent to address on file. 

## 2019-01-06 ENCOUNTER — Encounter: Payer: Self-pay | Admitting: Family Medicine

## 2019-05-22 ENCOUNTER — Ambulatory Visit: Payer: BC Managed Care – PPO | Attending: Internal Medicine

## 2019-05-22 DIAGNOSIS — Z23 Encounter for immunization: Secondary | ICD-10-CM | POA: Insufficient documentation

## 2019-05-22 NOTE — Progress Notes (Signed)
   Covid-19 Vaccination Clinic  Name:  Anne [name]    MRN: [id number] DOB: 1965-05-21  05/22/2019  [name] was observed post Covid-19 immunization for 15 minutes without incidence. She was provided with Vaccine Information Sheet and instruction to access the V-Safe system.   [name] was instructed to call 911 with any severe reactions post vaccine: Marland Kitchen Difficulty breathing  . Swelling of your face and throat  . A fast heartbeat  . A bad rash all over your body  . Dizziness and weakness    Immunizations Administered    Name Date Dose VIS Date Route   Pfizer COVID-19 Vaccine 05/22/2019  3:15 PM 0.3 mL 03/13/2019 Intramuscular   Manufacturer: [employer], [employer]   Lot: [id number]   NDC: [address]-4

## 2019-06-16 ENCOUNTER — Ambulatory Visit: Payer: BC Managed Care – PPO | Attending: Internal Medicine

## 2019-06-16 DIAGNOSIS — Z23 Encounter for immunization: Secondary | ICD-10-CM

## 2019-06-16 NOTE — Progress Notes (Signed)
   Covid-19 Vaccination Clinic  Name:  Anne [name]    MRN: [id number] DOB: [birth year]  06/16/2019  [name] was observed post Covid-19 immunization for 15 minutes without incident. She was provided with Vaccine Information Sheet and instruction to access the V-Safe system.   [name] was instructed to call 911 with any severe reactions post vaccine: Marland Kitchen Difficulty breathing  . Swelling of face and throat  . A fast heartbeat  . A bad rash all over body  . Dizziness and weakness   Immunizations Administered    Name Date Dose VIS Date Route   Pfizer COVID-19 Vaccine 06/16/2019  3:34 PM 0.3 mL 03/13/2019 Intramuscular   Manufacturer: [employer], [employer]   Lot: [id number]   NDC: [address]-0

## 2019-08-03 NOTE — Patient Instructions (Addendum)
It was good to see you again today, I will be in touch with your labs as soon as possible We will try a course of prednisone for your sciatica- avoid taking NSAIDS while on prednisone as it can irritate your stomach.  Tennis ball massage and glute stretching may also help.  Let me know if not better and take care!    Health Maintenance, Female Adopting a healthy lifestyle and getting preventive care are important in promoting health and wellness. Ask your health care provider about:  The right schedule for you to have regular tests and exams.  Things you can do on your own to prevent diseases and keep yourself healthy. What should I know about diet, weight, and exercise? Eat a healthy diet   Eat a diet that includes plenty of vegetables, fruits, low-fat dairy products, and lean protein.  Do not eat a lot of foods that are high in solid fats, added sugars, or sodium. Maintain a healthy weight Body mass index (BMI) is used to identify weight problems. It estimates body fat based on height and weight. Your health care provider can help determine your BMI and help you achieve or maintain a healthy weight. Get regular exercise Get regular exercise. This is one of the most important things you can do for your health. Most adults should:  Exercise for at least 150 minutes each week. The exercise should increase your heart rate and make you sweat (moderate-intensity exercise).  Do strengthening exercises at least twice a week. This is in addition to the moderate-intensity exercise.  Spend less time sitting. Even light physical activity can be beneficial. Watch cholesterol and blood lipids Have your blood tested for lipids and cholesterol at 55 years of age, then have this test every 5 years. Have your cholesterol levels checked more often if:  Your lipid or cholesterol levels are high.  You are older than 55 years of age.  You are at high risk for heart disease. What should I know about  cancer screening? Depending on your health history and family history, you may need to have cancer screening at various ages. This may include screening for:  Breast cancer.  Cervical cancer.  Colorectal cancer.  Skin cancer.  Lung cancer. What should I know about heart disease, diabetes, and high blood pressure? Blood pressure and heart disease  High blood pressure causes heart disease and increases the risk of stroke. This is more likely to develop in people who have high blood pressure readings, are of African descent, or are overweight.  Have your blood pressure checked: ? Every 3-5 years if you are 73-64 years of age. ? Every year if you are 17 years old or older. Diabetes Have regular diabetes screenings. This checks your fasting blood sugar level. Have the screening done:  Once every three years after age 32 if you are at a normal weight and have a low risk for diabetes.  More often and at a younger age if you are overweight or have a high risk for diabetes. What should I know about preventing infection? Hepatitis B If you have a higher risk for hepatitis B, you should be screened for this virus. Talk with your health care provider to find out if you are at risk for hepatitis B infection. Hepatitis C Testing is recommended for:  Everyone born from [birth year] through 1966.  Anyone with known risk factors for hepatitis C. Sexually transmitted infections (STIs)  Get screened for STIs, including gonorrhea and chlamydia, if: ?  You are sexually active and are younger than 55 years of age. ? You are older than 55 years of age and your health care provider tells you that you are at risk for this type of infection. ? Your sexual activity has changed since you were last screened, and you are at increased risk for chlamydia or gonorrhea. Ask your health care provider if you are at risk.  Ask your health care provider about whether you are at high risk for HIV. Your health care  provider may recommend a prescription medicine to help prevent HIV infection. If you choose to take medicine to prevent HIV, you should first get tested for HIV. You should then be tested every 3 months for as long as you are taking the medicine. Pregnancy  If you are about to stop having your period (premenopausal) and you may become pregnant, seek counseling before you get pregnant.  Take 400 to 800 micrograms (mcg) of folic acid every day if you become pregnant.  Ask for birth control (contraception) if you want to prevent pregnancy. Osteoporosis and menopause Osteoporosis is a disease in which the bones lose minerals and strength with aging. This can result in bone fractures. If you are 70 years old or older, or if you are at risk for osteoporosis and fractures, ask your health care provider if you should:  Be screened for bone loss.  Take a calcium or vitamin D supplement to lower your risk of fractures.  Be given hormone replacement therapy (HRT) to treat symptoms of menopause. Follow these instructions at home: Lifestyle  Do not use any products that contain nicotine or tobacco, such as cigarettes, e-cigarettes, and chewing tobacco. If you need help quitting, ask your health care provider.  Do not use street drugs.  Do not share needles.  Ask your health care provider for help if you need support or information about quitting drugs. Alcohol use  Do not drink alcohol if: ? Your health care provider tells you not to drink. ? You are pregnant, may be pregnant, or are planning to become pregnant.  If you drink alcohol: ? Limit how much you use to 0-1 drink a day. ? Limit intake if you are breastfeeding.  Be aware of how much alcohol is in your drink. In the [place] one drink equals one 12 oz bottle of beer (355 mL), one 5 oz glass of wine (148 mL), or one 1 oz glass of hard liquor (44 mL). General instructions  Schedule regular health, dental, and eye exams.  Stay current  with your vaccines.  Tell your health care provider if: ? You often feel depressed. ? You have ever been abused or do not feel safe at home. Summary  Adopting a healthy lifestyle and getting preventive care are important in promoting health and wellness.  Follow your health care provider's instructions about healthy diet, exercising, and getting tested or screened for diseases.  Follow your health care provider's instructions on monitoring your cholesterol and blood pressure. This information is not intended to replace advice given to you by your health care provider. Make sure you discuss any questions you have with your health care provider. Document Revised: 03/12/2018 Document Reviewed: 03/12/2018 Elsevier Patient Education  2020 [employer].

## 2019-08-03 NOTE — Progress Notes (Addendum)
[place] [facility] [place], [place], [place] [address] [phone]  Date:  08/05/2019   Name:  Anne [name]   DOB:  08/13/1965   MRN:  [id number]  PCP:  Darreld Mclean, MD    Chief Complaint: Annual Exam and Toe Pain (possible bone spur)   History of Present Illness:  Anne [name] is a 54 y.o. very pleasant female patient who presents with the following:  Patient here today for an annual physical exam Last seen by myself February 2019-at that time she was struggling with panic attacks, anxiety and depression due to several stressors, including her job and her son's health Her 21 year old son had been diagnosed with testicular cancer, was undergoing treatment at [facility] He has completed his treatment for now, he is in remission.  They are doing 4 month follow-ups now   At her last visit I continued BuSpar and added Wellbutrin- she is not taking these medications at this time.    Her gynecologist is Dr. Sherren Mocha Meisiner She is also seen by GI for chronic GERD  Colon cancer screening up-to-date Mammogram- per pt up to date  Pap- approx 2 years ago  Can do routine labs today if she would like COVID-19 vaccine- done  Patient Active Problem List   Diagnosis Date Noted  . Hoarseness   . Gastroesophageal reflux disease 10/26/2016  . Globus sensation 10/26/2016  . Other chest pain 10/26/2016  . Dyspnea on exertion 08/09/2016  . Cervical post-laminectomy syndrome 04/25/2016  . H/O Clostridium difficile infection 08/11/2015  . Barrett's esophagus 08/18/2013  . Scoliosis of lumbosacral spine 05/04/2013  . Menorrhagia 04/04/2012  . Eustachian tube dysfunction 01/10/2012  . DEPRESSIVE DISORDER 06/01/2009  . CEPHALGIA 08/27/2008    Past Medical History:  Diagnosis Date  . Anxiety   . Arthritis   . Barrett's esophagus   . Cervical pseudoarthrosis ([place])   . Chronic back pain   . Chronic bladder pain   .  Depression   . Gastritis   . GERD (gastroesophageal reflux disease)   . History of Clostridium difficile infection    recurrent  . History of condyloma acuminatum    vulvar 2000  . History of kidney stones 2012  . IC (interstitial cystitis)   . Insomnia   . Postlaminectomy syndrome of cervical region   . Sensation of pressure in bladder area   . Wears glasses     Past Surgical History:  Procedure Laterality Date  . [place] STUDY N/A 11/05/2016   Procedure: [place] STUDY;  Surgeon: Mauri Pole, MD;  Location: [facility];  Service: Endoscopy;  Laterality: N/A;  . ANTERIOR CERVICAL DECOMP/DISCECTOMY FUSION  2015   C5 -- C7  . BREAST ENHANCEMENT SURGERY Bilateral 2010  . CYSTO WITH HYDRODISTENSION N/A 07/02/2016   Procedure: CYSTOSCOPY/HYDRODISTENSION;  Surgeon: Carolan Clines, MD;  Location: [facility];  Service: Urology;  Laterality: N/A;  . DILATION AND CURETTAGE OF UTERUS     w/ suction for products of conception post partum  . DILITATION & CURRETTAGE/HYSTROSCOPY WITH NOVASURE ABLATION  04/04/2012   Procedure: DILATATION & CURETTAGE/HYSTEROSCOPY WITH NOVASURE ABLATION;  Surgeon: Cheri Fowler, MD;  Location: [facility];  Service: Gynecology;  Laterality: N/A;  . ESOPHAGEAL MANOMETRY N/A 11/05/2016   Procedure: ESOPHAGEAL MANOMETRY (EM);  Surgeon: Mauri Pole, MD;  Location: [facility];  Service: Endoscopy;  Laterality: N/A;  . ESOPHAGOGASTRODUODENOSCOPY  last one 06-21-2016  Social History   Tobacco Use  . Smoking status: Former Smoker    Packs/day: 0.75    Years: 7.00    Pack years: 5.25    Types: Cigarettes    Quit date: 04/03/1987    Years since quitting: 32.3  . Smokeless tobacco: Never Used  Substance Use Topics  . Alcohol use: Yes    Alcohol/week: 0.0 standard drinks    Comment: socially,maybe weekly  . Drug use: No    Family History  Problem Relation Age of Onset  . Ovarian cancer Mother   . Testicular cancer Father   .  Hypertension Father   . Cancer Maternal Grandmother        bone primary  . Cancer Paternal Grandmother        breast with lung mets  . Colon cancer Neg Hx   . Stomach cancer Neg Hx   . Esophageal cancer Neg Hx     Allergies  Allergen Reactions  . Iodinated Diagnostic Agents Shortness Of Breath    Respiratory distress   . Erythromycin Nausea And Vomiting       . Moxifloxacin Nausea And Vomiting         Medication list has been reviewed and updated.  Current Outpatient Medications on File Prior to Visit  Medication Sig Dispense Refill  . ALPRAZolam (XANAX) 0.5 MG tablet Take 0.5 mg by mouth as needed.    . budesonide-formoterol (SYMBICORT) 80-4.5 MCG/ACT inhaler Inhale 2 puffs into the lungs 2 (two) times daily. Rinse mouth after each use. 1 Inhaler 2  . CYCLAFEM 1/35 tablet Take 1 tablet by mouth every evening.     Marland Kitchen estradiol (ESTRACE) 0.1 MG/GM vaginal cream I 1 GRAM VAGINALLY 2 TIMES A WK  12  . famotidine (PEPCID) 40 MG tablet Take 1 tablet (40 mg total) by mouth 2 (two) times daily. 180 tablet 0  . saccharomyces boulardii (FLORASTOR) 250 MG capsule Take 250 mg by mouth 2 (two) times daily.    . sucralfate (CARAFATE) 1 GM/10ML suspension TAKE 10 ML BY MOUTH FOUR TIMES DAILY WITH MEALS AND AT BEDTIME 3600 mL 0  . zolpidem (AMBIEN) 10 MG tablet Take 0.5-1 tablets by mouth at bedtime.      No current facility-administered medications on file prior to visit.    Review of Systems:  As per HPI- otherwise negative.   Physical Examination: Vitals:   08/05/19 1300  BP: 135/86  Pulse: 64  Resp: 16  SpO2: 100%   Vitals:   08/05/19 1300  Weight: 133 lb (60.3 kg)  Height: 5' 7.5" (1.715 m)   Body mass index is 20.52 kg/m. Ideal Body Weight: Weight in (lb) to have BMI = 25: 161.7  GEN: no acute distress. Slim build, looks well  HEENT: Atraumatic, Normocephalic.  Ears and Nose: No external deformity. CV: RRR, No M/G/R. No JVD. No thrill. No extra heart sounds. PULM:  CTA B, no wheezes, crackles, rhonchi. No retractions. No resp. distress. No accessory muscle use. ABD: S, NT, ND, +BS. No rebound. No HSM. EXTR: No c/c/e PSYCH: Normally interactive. Conversant.  Bunion bilaterally and hammer toe right 4th toe  She notes tenderness of the right. Normal TL  Flexion-extension, normal bilateral lower extremity strength, sensation, DTR  Assessment and Plan: Physical exam  Screening for deficiency anemia - Plan: CBC  Screening for hyperlipidemia - Plan: Lipid panel  Screening for thyroid disorder - Plan: TSH  Screening for diabetes mellitus - Plan: Comprehensive metabolic panel, Hemoglobin A1c  Sciatica,  right side - Plan: predniSONE (DELTASONE) 20 MG tablet  Here today for a physical in a couple of other concerns.  Labs pending as above.  She is having symptoms of sciatica in the right side, sometimes worse if she sits for long period of time She would like to try prednisone, will call in an 8-day course for her.  Asked her to please keep me updated She has a hammertoe on the right foot and bilateral bunions.  We discussed this today, they are not overly problematic and she is able to manage her symptoms with selective footwear.  For now she prefers to manage conservatively  Discussed diet, exercise.  She does not smoke or drink to excess  Will plan further follow- up pending labs.  This visit occurred during the SARS-CoV-2 public health emergency.  Safety protocols were in place, including screening questions prior to the visit, additional usage of staff PPE, and extensive cleaning of exam room while observing appropriate contact time as indicated for disinfecting solutions.    Signed Abbe [place], MD  Received her labs as below, message to pt  Results for orders placed or performed in visit on 08/05/19  CBC  Result Value Ref Range   WBC 6.6 4.0 - 10.5 K/uL   RBC 4.24 3.87 - 5.11 Mil/uL   Platelets 187.0 150.0 - 400.0 K/uL   Hemoglobin 13.8  12.0 - 15.0 g/dL   HCT 93.8 10.1 - 75.1 %   MCV 96.1 78.0 - 100.0 fl   MCHC 33.7 30.0 - 36.0 g/dL   RDW 02.5 85.2 - 77.8 %  Comprehensive metabolic panel  Result Value Ref Range   Sodium 137 135 - 145 mEq/L   Potassium 3.7 3.5 - 5.1 mEq/L   Chloride 101 96 - 112 mEq/L   CO2 30 19 - 32 mEq/L   Glucose, Bld 140 (H) 70 - 99 mg/dL   BUN 9 6 - 23 mg/dL   Creatinine, Ser 2.42 0.40 - 1.20 mg/dL   Total Bilirubin 0.5 0.2 - 1.2 mg/dL   Alkaline Phosphatase 58 39 - 117 U/L   AST 14 0 - 37 U/L   ALT 8 0 - 35 U/L   Total Protein 6.4 6.0 - 8.3 g/dL   Albumin 4.2 3.5 - 5.2 g/dL   GFR 35.36 >14.43 mL/min   Calcium 8.8 8.4 - 10.5 mg/dL  Hemoglobin X5Q  Result Value Ref Range   Hgb A1c MFr Bld 4.6 4.6 - 6.5 %  Lipid panel  Result Value Ref Range   Cholesterol 183 0 - 200 mg/dL   Triglycerides 00.8 0.0 - 149.0 mg/dL   HDL 67.61 >95.09 mg/dL   VLDL 32.6 0.0 - 71.2 mg/dL   LDL Cholesterol 79 0 - 99 mg/dL   Total CHOL/HDL Ratio 2    NonHDL 97.10   TSH  Result Value Ref Range   TSH 1.06 0.35 - 4.50 uIU/mL

## 2019-08-05 ENCOUNTER — Encounter: Payer: Self-pay | Admitting: Family Medicine

## 2019-08-05 ENCOUNTER — Other Ambulatory Visit: Payer: Self-pay

## 2019-08-05 ENCOUNTER — Ambulatory Visit (INDEPENDENT_AMBULATORY_CARE_PROVIDER_SITE_OTHER): Payer: BC Managed Care – PPO | Admitting: Family Medicine

## 2019-08-05 VITALS — BP 135/86 | HR 64 | Resp 16 | Ht 67.5 in | Wt 133.0 lb

## 2019-08-05 DIAGNOSIS — M5431 Sciatica, right side: Secondary | ICD-10-CM

## 2019-08-05 DIAGNOSIS — Z130 Encounter for screening for diseases of the blood and blood-forming organs and certain disorders involving the immune mechanism: Secondary | ICD-10-CM

## 2019-08-05 DIAGNOSIS — Z131 Encounter for screening for diabetes mellitus: Secondary | ICD-10-CM

## 2019-08-05 DIAGNOSIS — Z1329 Encounter for screening for other suspected endocrine disorder: Secondary | ICD-10-CM | POA: Diagnosis not present

## 2019-08-05 DIAGNOSIS — Z0000 Encounter for general adult medical examination without abnormal findings: Secondary | ICD-10-CM

## 2019-08-05 DIAGNOSIS — Z13220 Encounter for screening for lipoid disorders: Secondary | ICD-10-CM

## 2019-08-05 LAB — COMPREHENSIVE METABOLIC PANEL
ALT: 8 U/L (ref 0–35)
AST: 14 U/L (ref 0–37)
Albumin: 4.2 g/dL (ref 3.5–5.2)
Alkaline Phosphatase: 58 U/L (ref 39–117)
BUN: 9 mg/dL (ref 6–23)
CO2: 30 mEq/L (ref 19–32)
Calcium: 8.8 mg/dL (ref 8.4–10.5)
Chloride: 101 mEq/L (ref 96–112)
Creatinine, Ser: 0.74 mg/dL (ref 0.40–1.20)
GFR: 81.59 mL/min (ref >60.00)
Glucose, Bld: 140 mg/dL — ABNORMAL HIGH (ref 70–99)
Potassium: 3.7 mEq/L (ref 3.5–5.1)
Sodium: 137 mEq/L (ref 135–145)
Total Bilirubin: 0.5 mg/dL (ref 0.2–1.2)
Total Protein: 6.4 g/dL (ref 6.0–8.3)

## 2019-08-05 LAB — LIPID PANEL
Cholesterol: 183 mg/dL (ref 0–200)
HDL: 85.7 mg/dL (ref >39.00)
LDL Cholesterol: 79 mg/dL (ref 0–99)
NonHDL: 97.1
Total CHOL/HDL Ratio: 2
Triglycerides: 93 mg/dL (ref 0.0–149.0)
VLDL: 18.6 mg/dL (ref 0.0–40.0)

## 2019-08-05 LAB — CBC
HCT: 40.8 % (ref 36.0–46.0)
Hemoglobin: 13.8 g/dL (ref 12.0–15.0)
MCHC: 33.7 g/dL (ref 30.0–36.0)
MCV: 96.1 fl (ref 78.0–100.0)
Platelets: 187 10*3/uL (ref 150.0–400.0)
RBC: 4.24 Mil/uL (ref 3.87–5.11)
RDW: 13.7 % (ref 11.5–15.5)
WBC: 6.6 10*3/uL (ref 4.0–10.5)

## 2019-08-05 LAB — HEMOGLOBIN A1C: Hgb A1c MFr Bld: 4.6 % (ref 4.6–6.5)

## 2019-08-05 LAB — TSH: TSH: 1.06 u[IU]/mL (ref 0.35–4.50)

## 2019-08-05 MED ORDER — PREDNISONE 20 MG PO TABS: ORAL_TABLET | ORAL | 0 refills | Status: DC

## 2019-09-05 NOTE — Progress Notes (Signed)
[place] [facility] [address] [place], [place] [address] [phone]  Date:  09/07/2019   Name:  Keir Foland Poteat   DOB:  13-Feb-1966   MRN:  [id number]  PCP:  Darreld Mclean, MD    Chief Complaint: Panick Attacks   History of Present Illness:  Danice Dippolito is a 54 y.o. very pleasant female patient who presents with the following:  Patient with history of cervical spine surgery, scoliosis, C. difficile infection, anxiety/depression/insomnia  Here today with concern of panic attacks/generalized anxiety I saw her for physical just about 1 month ago.  At that time her anxiety and panic attacks were under okay control, she was no longer taking BuSpar or Wellbutrin At this time she notes that she may ruminate and have obsessive thoughts that bother her and cause excessive anxiety She tends to worry about her job a lot, and also her family.  She may think of the worst case scenario. For example, when her son is out driving in the evening she will imagine him being hit by a drunk driver.  She is sleeping pretty well in general Not as much depression as anxiety- she may get really upset and overthink things at work. She did try amitriptyline in the past for her stomach sx and did not tolerate this well  Mammogram per patient up-to-date Pap-per GYN  COVID-19 series is complete Complete labs on chart from last month  Her GYN is prescribing alprazolam as well as Ambien  09/01/2019  1   09/01/2019  Alprazolam 0.5 MG Tablet  30.00  15 To Mei   [id number]   Dug (1959)   0  2.00 LME  Comm Ins   [place]  08/28/2019  1   08/28/2019  Testosterone Micronized Powder  0.01  [address]   [id number]   Cus ([id number])   0   Private Pay   [place]  08/17/2019  1   08/17/2019  Alprazolam 0.5 MG Tablet  30.00  15 To Mei   [id number]   Dug (1959)   0  2.00 LME  Comm Ins   [place]  08/13/2019  1   08/13/2019  Zolpidem Tartrate 10 MG Tablet  45.00  30 To Mei   [id number]    Dug (1959)   0  0.75 LME  Comm Ins   [place]  08/04/2019  1   08/04/2019  Alprazolam 0.5 MG Tablet  30.00  15 To Mei   [id number]   Dug (1959)   0  2.00 LME  Comm Ins   [place]  07/21/2019  1   07/21/2019  Alprazolam 0.5 MG Tablet  30.00  15 To Mei   [id number]   Dug (1959)   0  2.00 LME  Comm Ins   [place]  07/14/2019  2   07/14/2019  Testosterone Micronized Powder  0.01  [address]   [id number]   Cus ([id number])   0   Private Pay   [place]  07/07/2019  1   07/07/2019  Zolpidem Tartrate 10 MG Tablet  45.00  30 To Mei   [id number]   Dug (1959)   0  0.75 LME  Comm Ins   [place]  07/07/2019  1   07/07/2019  Alprazolam 0.5 MG Tablet  30.00  15 To Mei   [id number]   Dug (1959)   0  2.00 LME  Comm Ins   [place]  06/23/2019  1  06/23/2019  Alprazolam 0.5 MG Tablet  30.00  15 To Mei   [id number]   Dug (1959)   0  2.00 LME        Patient Active Problem List   Diagnosis Date Noted  . Hoarseness   . Gastroesophageal reflux disease 10/26/2016  . Globus sensation 10/26/2016  . Other chest pain 10/26/2016  . Dyspnea on exertion 08/09/2016  . Cervical post-laminectomy syndrome 04/25/2016  . H/O Clostridium difficile infection 08/11/2015  . Barrett's esophagus 08/18/2013  . Scoliosis of lumbosacral spine 05/04/2013  . Menorrhagia 04/04/2012  . Eustachian tube dysfunction 01/10/2012  . DEPRESSIVE DISORDER 06/01/2009  . CEPHALGIA 08/27/2008    Past Medical History:  Diagnosis Date  . Anxiety   . Arthritis   . Barrett's esophagus   . Cervical pseudoarthrosis ([place])   . Chronic back pain   . Chronic bladder pain   . Depression   . Gastritis   . GERD (gastroesophageal reflux disease)   . History of Clostridium difficile infection    recurrent  . History of condyloma acuminatum    vulvar 2000  . History of kidney stones 2012  . IC (interstitial cystitis)   . Insomnia   . Postlaminectomy syndrome of cervical region   . Sensation of pressure in bladder area   . Wears glasses     Past Surgical History:  Procedure Laterality Date  . [place] STUDY N/A 11/05/2016   Procedure: [place] STUDY;  Surgeon: Mauri Pole, MD;  Location: [facility];  Service: Endoscopy;  Laterality: N/A;  . ANTERIOR CERVICAL DECOMP/DISCECTOMY FUSION  2015   C5 -- C7  . BREAST ENHANCEMENT SURGERY Bilateral 2010  . CYSTO WITH HYDRODISTENSION N/A 07/02/2016   Procedure: CYSTOSCOPY/HYDRODISTENSION;  Surgeon: Carolan Clines, MD;  Location: [facility];  Service: Urology;  Laterality: N/A;  . DILATION AND CURETTAGE OF UTERUS     w/ suction for products of conception post partum  . DILITATION & CURRETTAGE/HYSTROSCOPY WITH NOVASURE ABLATION  04/04/2012   Procedure: DILATATION & CURETTAGE/HYSTEROSCOPY WITH NOVASURE ABLATION;  Surgeon: Cheri Fowler, MD;  Location: [facility];  Service: Gynecology;  Laterality: N/A;  . ESOPHAGEAL MANOMETRY N/A 11/05/2016   Procedure: ESOPHAGEAL MANOMETRY (EM);  Surgeon: Mauri Pole, MD;  Location: [facility];  Service: Endoscopy;  Laterality: N/A;  . ESOPHAGOGASTRODUODENOSCOPY  last one 06-21-2016    Social History   Tobacco Use  . Smoking status: Former Smoker    Packs/day: 0.75    Years: 7.00    Pack years: 5.25    Types: Cigarettes    Quit date: 04/03/1987    Years since quitting: 32.4  . Smokeless tobacco: Never Used  Substance Use Topics  . Alcohol use: Yes    Alcohol/week: 0.0 standard drinks    Comment: socially,maybe weekly  . Drug use: No    Family History  Problem Relation Age of Onset  . Ovarian cancer Mother   . Testicular cancer Father   . Hypertension Father   . Cancer Maternal Grandmother        bone primary  . Cancer Paternal Grandmother        breast with lung mets  . Colon cancer Neg Hx   . Stomach cancer Neg Hx   . Esophageal cancer Neg Hx     Allergies  Allergen Reactions  . Iodinated Diagnostic Agents Shortness Of Breath    Respiratory distress   . Erythromycin Nausea And Vomiting       .  Moxifloxacin Nausea And Vomiting         Medication list  has been reviewed and updated.  Current Outpatient Medications on File Prior to Visit  Medication Sig Dispense Refill  . ALPRAZolam (XANAX) 0.5 MG tablet Take 0.5 mg by mouth as needed.    Marland Kitchen esomeprazole (NEXIUM) 40 MG capsule Take 40 mg by mouth 2 (two) times daily.    Marland Kitchen estradiol (ESTRACE) 0.1 MG/GM vaginal cream I 1 GRAM VAGINALLY 2 TIMES A WK  12  . predniSONE (DELTASONE) 20 MG tablet Take 2 pills daily for 4 days, then 1 pill daily 4 for sciatica 12 tablet 0  . saccharomyces boulardii (FLORASTOR) 250 MG capsule Take 250 mg by mouth 2 (two) times daily.    Marland Kitchen zolpidem (AMBIEN) 10 MG tablet Take 0.5-1 tablets by mouth at bedtime.      No current facility-administered medications on file prior to visit.    Review of Systems:  As per HPI- otherwise negative.   Physical Examination: Vitals:   09/07/19 1038  BP: 122/80  Pulse: 67  Resp: 16  Temp: 97.6 F (36.4 C)  SpO2: 98%   Vitals:   09/07/19 1038  Weight: 132 lb (59.9 kg)  Height: 5' 7.5" (1.715 m)   Body mass index is 20.37 kg/m. Ideal Body Weight: Weight in (lb) to have BMI = 25: 161.7  GEN: no acute distress.  Slim build, looks well HEENT: Atraumatic, Normocephalic.  Ears and Nose: No external deformity. CV: RRR, No M/G/R. No JVD. No thrill. No extra heart sounds. PULM: CTA B, no wheezes, crackles, rhonchi. No retractions. No resp. distress. No accessory muscle use. EXTR: No c/c/e PSYCH: Normally interactive. Conversant.    Assessment and Plan: GAD (generalized anxiety disorder) - Plan: FLUoxetine (PROZAC) 10 MG capsule  Patient with history of anxiety, here today with worsening symptoms.  She is using alprazolam and Ambien as needed, but notes intrusive ruminating thoughts and anxiety during the day as well.  After discussion, she would like to try fluoxetine.  Prescribed 10 mg, we can plan to increase to 20 mg in 1 to 2 weeks assuming well-tolerated.  We discussed most common side effects and what to expect  from medication  I asked her to update me via MyChart in 2 to 3 weeks and she agrees to do so-sooner if not doing okay Denies any suicidal ideation Moderate medical decision making today This visit occurred during the SARS-CoV-2 public health emergency.  Safety protocols were in place, including screening questions prior to the visit, additional usage of staff PPE, and extensive cleaning of exam room while observing appropriate contact time as indicated for disinfecting solutions.    Signed Lamar Blinks, MD

## 2019-09-07 ENCOUNTER — Ambulatory Visit: Payer: BC Managed Care – PPO | Admitting: Family Medicine

## 2019-09-07 ENCOUNTER — Other Ambulatory Visit: Payer: Self-pay

## 2019-09-07 ENCOUNTER — Encounter: Payer: Self-pay | Admitting: Family Medicine

## 2019-09-07 VITALS — BP 122/80 | HR 67 | Temp 97.6°F | Resp 16 | Ht 67.5 in | Wt 132.0 lb

## 2019-09-07 DIAGNOSIS — F411 Generalized anxiety disorder: Secondary | ICD-10-CM

## 2019-09-07 MED ORDER — FLUOXETINE HCL 10 MG PO CAPS: 10.0000 mg | ORAL_CAPSULE | Freq: Every day | ORAL | 5 refills | Status: DC

## 2019-09-07 NOTE — Patient Instructions (Signed)
It was good to see you today- I am sorry that anxiety has been troubling you so much   Please try fluoxetine 10 mg- you can increase to 20 mg after a week or so if you like.  Please let me know how this works for you- please send me an update via mychart in 2-3 weeks, sooner if not doing ok

## 2019-10-14 ENCOUNTER — Encounter: Payer: Self-pay | Admitting: Family Medicine

## 2019-11-05 LAB — HM PAP SMEAR: HM Pap smear: NORMAL

## 2019-11-05 LAB — RESULTS CONSOLE HPV: CHL HPV: NEGATIVE

## 2020-02-29 ENCOUNTER — Encounter: Payer: Self-pay | Admitting: Family Medicine

## 2020-02-29 DIAGNOSIS — F411 Generalized anxiety disorder: Secondary | ICD-10-CM

## 2020-03-01 MED ORDER — FLUOXETINE HCL 10 MG PO CAPS: 10.0000 mg | ORAL_CAPSULE | Freq: Every day | ORAL | 1 refills | Status: DC

## 2020-04-13 ENCOUNTER — Encounter: Payer: Self-pay | Admitting: Family Medicine

## 2020-09-04 NOTE — Progress Notes (Addendum)
[facility] [address], [address] [place], [place] [address] [phone]  Date:  09/07/2020   Name:  Anne [name]   DOB:  06-07-1965   MRN:  [id number]  PCP:  Pearline Cables, MD    Chief Complaint: Annual Exam   History of Present Illness:  Anne [name] is a 55 y.o. very pleasant female patient who presents with the following:  Patient seen today for physical.  Last visit with myself 1 year ago History of anxiety, panic attacks, cervical spine surgery, scoliosis  Can offer HIV and hep C screening- pt declines  Pap will be done by her GYN coming up  Mammogram- also pending per GYN Shingles vaccine- she is thinking about doing this  COVID-19 fourth dose- she plans to do this today if she can  Labs one year ago   She is doing well on prozac 10 mg BID- she prefers to continue this as opposed to taking 20 mg once a day  Patient Active Problem List   Diagnosis Date Noted  . Hoarseness   . Gastroesophageal reflux disease 10/26/2016  . Globus sensation 10/26/2016  . Other chest pain 10/26/2016  . Dyspnea on exertion 08/09/2016  . Cervical post-laminectomy syndrome 04/25/2016  . H/O Clostridium difficile infection 08/11/2015  . Barrett's esophagus 08/18/2013  . Scoliosis of lumbosacral spine 05/04/2013  . Menorrhagia 04/04/2012  . Eustachian tube dysfunction 01/10/2012  . DEPRESSIVE DISORDER 06/01/2009  . CEPHALGIA 08/27/2008    Past Medical History:  Diagnosis Date  . Anxiety   . Arthritis   . Barrett's esophagus   . Cervical pseudoarthrosis (HCC)   . Chronic back pain   . Chronic bladder pain   . Depression   . Gastritis   . GERD (gastroesophageal reflux disease)   . History of Clostridium difficile infection    recurrent  . History of condyloma acuminatum    vulvar 2000  . History of kidney stones 2012  . IC (interstitial cystitis)   . Insomnia   . Postlaminectomy syndrome of cervical  region   . Sensation of pressure in bladder area   . Wears glasses     Past Surgical History:  Procedure Laterality Date  . 24 HOUR PH STUDY N/A 11/05/2016   Procedure: 24 HOUR PH STUDY;  Surgeon: Napoleon Form, MD;  Location: [facility];  Service: Endoscopy;  Laterality: N/A;  . ANTERIOR CERVICAL DECOMP/DISCECTOMY FUSION  2015   C5 -- C7  . BREAST ENHANCEMENT SURGERY Bilateral 2010  . CYSTO WITH HYDRODISTENSION N/A 07/02/2016   Procedure: CYSTOSCOPY/HYDRODISTENSION;  Surgeon: Jethro Bolus, MD;  Location: [facility];  Service: Urology;  Laterality: N/A;  . DILATION AND CURETTAGE OF UTERUS     w/ suction for products of conception post partum  . DILITATION & CURRETTAGE/HYSTROSCOPY WITH NOVASURE ABLATION  04/04/2012   Procedure: DILATATION & CURETTAGE/HYSTEROSCOPY WITH NOVASURE ABLATION;  Surgeon: Lavina Hamman, MD;  Location: [facility];  Service: Gynecology;  Laterality: N/A;  . ESOPHAGEAL MANOMETRY N/A 11/05/2016   Procedure: ESOPHAGEAL MANOMETRY (EM);  Surgeon: Napoleon Form, MD;  Location: [facility];  Service: Endoscopy;  Laterality: N/A;  . ESOPHAGOGASTRODUODENOSCOPY  last one 06-21-2016    Social History   Tobacco Use  . Smoking status: Former Smoker    Packs/day: 0.75    Years: 7.00    Pack years: 5.25    Types: Cigarettes    Quit date: 04/03/1987  Years since quitting: 33.4  . Smokeless tobacco: Never Used  Vaping Use  . Vaping Use: Never used  Substance Use Topics  . Alcohol use: Yes    Alcohol/week: 0.0 standard drinks    Comment: socially,maybe weekly  . Drug use: No    Family History  Problem Relation Age of Onset  . Ovarian cancer Mother   . Testicular cancer Father   . Hypertension Father   . Cancer Maternal Grandmother        bone primary  . Cancer Paternal Grandmother        breast with lung mets  . Colon cancer Neg Hx   . Stomach cancer Neg Hx   . Esophageal cancer Neg Hx     Allergies  Allergen Reactions  .  Iodinated Diagnostic Agents Shortness Of Breath    Respiratory distress   . Erythromycin Nausea And Vomiting       . Moxifloxacin Nausea And Vomiting         Medication list has been reviewed and updated.  Current Outpatient Medications on File Prior to Visit  Medication Sig Dispense Refill  . ALPRAZolam (XANAX) 0.5 MG tablet Take 0.5 mg by mouth as needed.    Marland Kitchen esomeprazole (NEXIUM) 40 MG capsule Take 40 mg by mouth 2 (two) times daily.    Marland Kitchen estradiol (ESTRACE) 0.1 MG/GM vaginal cream I 1 GRAM VAGINALLY 2 TIMES A WK  12  . saccharomyces boulardii (FLORASTOR) 250 MG capsule Take 250 mg by mouth 2 (two) times daily.    Marland Kitchen zolpidem (AMBIEN) 10 MG tablet Take 0.5-1 tablets by mouth at bedtime.      No current facility-administered medications on file prior to visit.    Review of Systems:  As per HPI- otherwise negative. Her anxiety is under good control She is trying to exercise by doing some work in her yard   Physical Examination: Vitals:   09/07/20 1131  BP: 124/72  Pulse: 66  Resp: 16  Temp: (!) 97.5 F (36.4 C)  SpO2: 99%   Vitals:   09/07/20 1131  Weight: 138 lb (62.6 kg)  Height: 5' 7.5" (1.715 m)   Body mass index is 21.29 kg/m. Ideal Body Weight: Weight in (lb) to have BMI = 25: 161.7  GEN: no acute distress. Slim build, looks well  HEENT: Atraumatic, Normocephalic.   Bilateral TM wnl, oropharynx normal.  PEERL,EOMI.   Ears and Nose: No external deformity. CV: RRR, No M/G/R. No JVD. No thrill. No extra heart sounds. PULM: CTA B, no wheezes, crackles, rhonchi. No retractions. No resp. distress. No accessory muscle use. ABD: S, NT, ND. No rebound. No HSM. EXTR: No c/c/e PSYCH: Normally interactive. Conversant.    Assessment and Plan: Physical exam  Screening for deficiency anemia - Plan: CBC  Screening for diabetes mellitus - Plan: Comprehensive metabolic panel, Hemoglobin A1c  Screening for thyroid disorder - Plan: TSH  Screening for  hyperlipidemia - Plan: Lipid panel  Fatigue, unspecified type - Plan: TSH, VITAMIN D 25 Hydroxy (Vit-D Deficiency, Fractures)  GAD (generalized anxiety disorder) - Plan: FLUoxetine (PROZAC) 10 MG capsule   Physical exam today. Encouraged healthy diet and exercise routine Will plan further follow- up pending labs. She is seeing GYN for her pap and mammo Discussed immunizations for her   This visit occurred during the SARS-CoV-2 public health emergency.  Safety protocols were in place, including screening questions prior to the visit, additional usage of staff PPE, and extensive cleaning of exam room while  observing appropriate contact time as indicated for disinfecting solutions.    Signed Abbe Amsterdam, MD  Received her labs as below, message to pt  Results for orders placed or performed in visit on 09/07/20  CBC  Result Value Ref Range   WBC 6.7 4.0 - 10.5 K/uL   RBC 4.13 3.87 - 5.11 Mil/uL   Platelets 204.0 150.0 - 400.0 K/uL   Hemoglobin 13.9 12.0 - 15.0 g/dL   HCT 11.9 14.7 - 82.9 %   MCV 100.4 (H) 78.0 - 100.0 fl   MCHC 33.4 30.0 - 36.0 g/dL   RDW 56.2 13.0 - 86.5 %  Comprehensive metabolic panel  Result Value Ref Range   Sodium 138 135 - 145 mEq/L   Potassium 4.0 3.5 - 5.1 mEq/L   Chloride 101 96 - 112 mEq/L   CO2 27 19 - 32 mEq/L   Glucose, Bld 86 70 - 99 mg/dL   BUN 11 6 - 23 mg/dL   Creatinine, Ser 7.84 0.40 - 1.20 mg/dL   Total Bilirubin 0.8 0.2 - 1.2 mg/dL   Alkaline Phosphatase 56 39 - 117 U/L   AST 18 0 - 37 U/L   ALT 9 0 - 35 U/L   Total Protein 7.2 6.0 - 8.3 g/dL   Albumin 4.6 3.5 - 5.2 g/dL   GFR 69.62 >95.28 mL/min   Calcium 9.1 8.4 - 10.5 mg/dL  Hemoglobin U1L  Result Value Ref Range   Hgb A1c MFr Bld 4.7 4.6 - 6.5 %  Lipid panel  Result Value Ref Range   Cholesterol 214 (H) 0 - 200 mg/dL   Triglycerides 24.4 0.0 - 149.0 mg/dL   HDL 010.27 >25.36 mg/dL   VLDL 64.4 0.0 - 03.4 mg/dL   LDL Cholesterol 92 0 - 99 mg/dL   Total CHOL/HDL Ratio 2     NonHDL 110.10   TSH  Result Value Ref Range   TSH 1.27 0.35 - 4.50 uIU/mL  VITAMIN D 25 Hydroxy (Vit-D Deficiency, Fractures)  Result Value Ref Range   VITD 37.91 30.00 - 100.00 ng/mL

## 2020-09-04 NOTE — Patient Instructions (Addendum)
Good to see you today- I will be in touch with your labs asap Continue to work on exercise/ time outdoors for stress management!    You can get your shingles vaccine (Shingrix) at your convenience Also ok to do covid 4th dose now if you like     Health Maintenance, Female Adopting a healthy lifestyle and getting preventive care are important in promoting health and wellness. Ask your health care provider about:  The right schedule for you to have regular tests and exams.  Things you can do on your own to prevent diseases and keep yourself healthy. What should I know about diet, weight, and exercise? Eat a healthy diet  Eat a diet that includes plenty of vegetables, fruits, low-fat dairy products, and lean protein.  Do not eat a lot of foods that are high in solid fats, added sugars, or sodium.   Maintain a healthy weight Body mass index (BMI) is used to identify weight problems. It estimates body fat based on height and weight. Your health care provider can help determine your BMI and help you achieve or maintain a healthy weight. Get regular exercise Get regular exercise. This is one of the most important things you can do for your health. Most adults should:  Exercise for at least 150 minutes each week. The exercise should increase your heart rate and make you sweat (moderate-intensity exercise).  Do strengthening exercises at least twice a week. This is in addition to the moderate-intensity exercise.  Spend less time sitting. Even light physical activity can be beneficial. Watch cholesterol and blood lipids Have your blood tested for lipids and cholesterol at 56 years of age, then have this test every 5 years. Have your cholesterol levels checked more often if:  Your lipid or cholesterol levels are high.  You are older than 56 years of age.  You are at high risk for heart disease. What should I know about cancer screening? Depending on your health history and family  history, you may need to have cancer screening at various ages. This may include screening for:  Breast cancer.  Cervical cancer.  Colorectal cancer.  Skin cancer.  Lung cancer. What should I know about heart disease, diabetes, and high blood pressure? Blood pressure and heart disease  High blood pressure causes heart disease and increases the risk of stroke. This is more likely to develop in people who have high blood pressure readings, are of African descent, or are overweight.  Have your blood pressure checked: ? Every 3-5 years if you are 30-58 years of age. ? Every year if you are 81 years old or older. Diabetes Have regular diabetes screenings. This checks your fasting blood sugar level. Have the screening done:  Once every three years after age 51 if you are at a normal weight and have a low risk for diabetes.  More often and at a younger age if you are overweight or have a high risk for diabetes. What should I know about preventing infection? Hepatitis B If you have a higher risk for hepatitis B, you should be screened for this virus. Talk with your health care provider to find out if you are at risk for hepatitis B infection. Hepatitis C Testing is recommended for:  Everyone born from [birth year] through 1966.  Anyone with known risk factors for hepatitis C. Sexually transmitted infections (STIs)  Get screened for STIs, including gonorrhea and chlamydia, if: ? You are sexually active and are younger than 56 years of age. ?  You are older than 56 years of age and your health care provider tells you that you are at risk for this type of infection. ? Your sexual activity has changed since you were last screened, and you are at increased risk for chlamydia or gonorrhea. Ask your health care provider if you are at risk.  Ask your health care provider about whether you are at high risk for HIV. Your health care provider may recommend a prescription medicine to help prevent HIV  infection. If you choose to take medicine to prevent HIV, you should first get tested for HIV. You should then be tested every 3 months for as long as you are taking the medicine. Pregnancy  If you are about to stop having your period (premenopausal) and you may become pregnant, seek counseling before you get pregnant.  Take 400 to 800 micrograms (mcg) of folic acid every day if you become pregnant.  Ask for birth control (contraception) if you want to prevent pregnancy. Osteoporosis and menopause Osteoporosis is a disease in which the bones lose minerals and strength with aging. This can result in bone fractures. If you are 13 years old or older, or if you are at risk for osteoporosis and fractures, ask your health care provider if you should:  Be screened for bone loss.  Take a calcium or vitamin D supplement to lower your risk of fractures.  Be given hormone replacement therapy (HRT) to treat symptoms of menopause. Follow these instructions at home: Lifestyle  Do not use any products that contain nicotine or tobacco, such as cigarettes, e-cigarettes, and chewing tobacco. If you need help quitting, ask your health care provider.  Do not use street drugs.  Do not share needles.  Ask your health care provider for help if you need support or information about quitting drugs. Alcohol use  Do not drink alcohol if: ? Your health care provider tells you not to drink. ? You are pregnant, may be pregnant, or are planning to become pregnant.  If you drink alcohol: ? Limit how much you use to 0-1 drink a day. ? Limit intake if you are breastfeeding.  Be aware of how much alcohol is in your drink. In the [place] one drink equals one 12 oz bottle of beer (355 mL), one 5 oz glass of wine (148 mL), or one 1 oz glass of hard liquor (44 mL). General instructions  Schedule regular health, dental, and eye exams.  Stay current with your vaccines.  Tell your health care provider if: ? You  often feel depressed. ? You have ever been abused or do not feel safe at home. Summary  Adopting a healthy lifestyle and getting preventive care are important in promoting health and wellness.  Follow your health care provider's instructions about healthy diet, exercising, and getting tested or screened for diseases.  Follow your health care provider's instructions on monitoring your cholesterol and blood pressure. This information is not intended to replace advice given to you by your health care provider. Make sure you discuss any questions you have with your health care provider. Document Revised: 03/12/2018 Document Reviewed: 03/12/2018 Elsevier Patient Education  2021 [employer].

## 2020-09-07 ENCOUNTER — Other Ambulatory Visit: Payer: Self-pay

## 2020-09-07 ENCOUNTER — Encounter: Payer: Self-pay | Admitting: Family Medicine

## 2020-09-07 ENCOUNTER — Ambulatory Visit (INDEPENDENT_AMBULATORY_CARE_PROVIDER_SITE_OTHER): Payer: BC Managed Care – PPO | Admitting: Family Medicine

## 2020-09-07 VITALS — BP 124/72 | HR 66 | Temp 97.5°F | Resp 16 | Ht 67.5 in | Wt 138.0 lb

## 2020-09-07 DIAGNOSIS — Z131 Encounter for screening for diabetes mellitus: Secondary | ICD-10-CM | POA: Diagnosis not present

## 2020-09-07 DIAGNOSIS — Z13220 Encounter for screening for lipoid disorders: Secondary | ICD-10-CM | POA: Diagnosis not present

## 2020-09-07 DIAGNOSIS — Z0000 Encounter for general adult medical examination without abnormal findings: Secondary | ICD-10-CM | POA: Diagnosis not present

## 2020-09-07 DIAGNOSIS — R5383 Other fatigue: Secondary | ICD-10-CM

## 2020-09-07 DIAGNOSIS — Z130 Encounter for screening for diseases of the blood and blood-forming organs and certain disorders involving the immune mechanism: Secondary | ICD-10-CM

## 2020-09-07 DIAGNOSIS — F411 Generalized anxiety disorder: Secondary | ICD-10-CM

## 2020-09-07 DIAGNOSIS — Z1329 Encounter for screening for other suspected endocrine disorder: Secondary | ICD-10-CM | POA: Diagnosis not present

## 2020-09-07 LAB — VITAMIN D 25 HYDROXY (VIT D DEFICIENCY, FRACTURES): VITD: 37.91 ng/mL (ref 30.00–100.00)

## 2020-09-07 LAB — LIPID PANEL
Cholesterol: 214 mg/dL — ABNORMAL HIGH (ref 0–200)
HDL: 103.8 mg/dL (ref >39.00)
LDL Cholesterol: 92 mg/dL (ref 0–99)
NonHDL: 110.1
Total CHOL/HDL Ratio: 2
Triglycerides: 92 mg/dL (ref 0.0–149.0)
VLDL: 18.4 mg/dL (ref 0.0–40.0)

## 2020-09-07 LAB — COMPREHENSIVE METABOLIC PANEL
ALT: 9 U/L (ref 0–35)
AST: 18 U/L (ref 0–37)
Albumin: 4.6 g/dL (ref 3.5–5.2)
Alkaline Phosphatase: 56 U/L (ref 39–117)
BUN: 11 mg/dL (ref 6–23)
CO2: 27 mEq/L (ref 19–32)
Calcium: 9.1 mg/dL (ref 8.4–10.5)
Chloride: 101 mEq/L (ref 96–112)
Creatinine, Ser: 0.76 mg/dL (ref 0.40–1.20)
GFR: 88.01 mL/min (ref >60.00)
Glucose, Bld: 86 mg/dL (ref 70–99)
Potassium: 4 mEq/L (ref 3.5–5.1)
Sodium: 138 mEq/L (ref 135–145)
Total Bilirubin: 0.8 mg/dL (ref 0.2–1.2)
Total Protein: 7.2 g/dL (ref 6.0–8.3)

## 2020-09-07 LAB — CBC
HCT: 41.5 % (ref 36.0–46.0)
Hemoglobin: 13.9 g/dL (ref 12.0–15.0)
MCHC: 33.4 g/dL (ref 30.0–36.0)
MCV: 100.4 fl — ABNORMAL HIGH (ref 78.0–100.0)
Platelets: 204 10*3/uL (ref 150.0–400.0)
RBC: 4.13 Mil/uL (ref 3.87–5.11)
RDW: 12.5 % (ref 11.5–15.5)
WBC: 6.7 10*3/uL (ref 4.0–10.5)

## 2020-09-07 LAB — HEMOGLOBIN A1C: Hgb A1c MFr Bld: 4.7 % (ref 4.6–6.5)

## 2020-09-07 LAB — TSH: TSH: 1.27 u[IU]/mL (ref 0.35–4.50)

## 2020-09-07 MED ORDER — FLUOXETINE HCL 10 MG PO CAPS: 10.0000 mg | ORAL_CAPSULE | Freq: Two times a day (BID) | ORAL | 3 refills | Status: DC

## 2020-09-07 MED ORDER — FLUOXETINE HCL 10 MG PO CAPS: 10.0000 mg | ORAL_CAPSULE | Freq: Two times a day (BID) | ORAL | 0 refills | Status: DC

## 2021-02-28 ENCOUNTER — Encounter: Payer: Self-pay | Admitting: Family Medicine

## 2021-02-28 DIAGNOSIS — F411 Generalized anxiety disorder: Secondary | ICD-10-CM

## 2021-03-01 MED ORDER — FLUOXETINE HCL 10 MG PO CAPS: 10.0000 mg | ORAL_CAPSULE | Freq: Three times a day (TID) | ORAL | 3 refills | Status: DC

## 2021-11-22 ENCOUNTER — Other Ambulatory Visit: Payer: Self-pay | Admitting: Obstetrics and Gynecology

## 2021-11-22 DIAGNOSIS — N6489 Other specified disorders of breast: Secondary | ICD-10-CM

## 2021-11-30 ENCOUNTER — Ambulatory Visit
Admission: RE | Admit: 2021-11-30 | Discharge: 2021-11-30 | Disposition: A | Discharge status: Home / Self Care | Payer: BC Managed Care – PPO | Source: Ambulatory Visit | Attending: Obstetrics and Gynecology | Admitting: Obstetrics and Gynecology

## 2021-11-30 DIAGNOSIS — N6489 Other specified disorders of breast: Secondary | ICD-10-CM

## 2021-11-30 LAB — HM MAMMOGRAPHY: HM Mammogram: NORMAL (ref 0–4)

## 2021-12-07 ENCOUNTER — Other Ambulatory Visit: Payer: Self-pay

## 2021-12-07 ENCOUNTER — Telehealth: Payer: Self-pay | Admitting: Family Medicine

## 2021-12-07 DIAGNOSIS — F411 Generalized anxiety disorder: Secondary | ICD-10-CM

## 2021-12-07 MED ORDER — FLUOXETINE HCL 10 MG PO CAPS: 10.0000 mg | ORAL_CAPSULE | Freq: Three times a day (TID) | ORAL | 3 refills | Status: DC

## 2021-12-07 NOTE — Telephone Encounter (Signed)
Medication: FLUoxetine (PROZAC) 10 MG capsule  Has the patient contacted their pharmacy? No.   Preferred Pharmacy:  [facility] - [place], [place] - [facility] AT Portal to Registered [address]   [address] [place], [place] [address]  Phone:  [phone]  Fax:  [phone]

## 2021-12-07 NOTE — Telephone Encounter (Signed)
Refilled

## 2021-12-29 NOTE — Patient Instructions (Incomplete)
It was great to see you again today, I will be in touch with your labs soon as possible Referral placed to GI at [facility] for screening colonoscopy Keep up the good work with exercise!

## 2021-12-29 NOTE — Progress Notes (Unsigned)
[facility] [address], [place], [place] [address] [phone]  Date:  01/01/2022   Name:  Anne [name]   DOB:  September 02, 1965   MRN:  [id number]  PCP:  Darreld Mclean, MD    Chief Complaint: No chief complaint on file.   History of Present Illness:  Anne [name] is a 56 y.o. very pleasant female patient who presents with the following:  Patient seen today for periodic physical exam Last seen by myself in June 2022 History of anxiety, panic attacks, cervical spine surgery, scoliosis  She does have GYN care Mammogram Shingrix Colon cancer screening-she did Cologuard in 2017 Recommend COVID booster Flu shot Update labs today  Fluoxetine 10 mg 3 times daily-she increased to 3 times daily last year when her father passed away Patient Active Problem List   Diagnosis Date Noted   Hoarseness    Gastroesophageal reflux disease 10/26/2016   Globus sensation 10/26/2016   Other chest pain 10/26/2016   Dyspnea on exertion 08/09/2016   Cervical post-laminectomy syndrome 04/25/2016   H/O Clostridium difficile infection 08/11/2015   Barrett's esophagus 08/18/2013   Scoliosis of lumbosacral spine 05/04/2013   Menorrhagia 04/04/2012   Eustachian tube dysfunction 01/10/2012   DEPRESSIVE DISORDER 06/01/2009   CEPHALGIA 08/27/2008    Past Medical History:  Diagnosis Date   Anxiety    Arthritis    Barrett's esophagus    Cervical pseudoarthrosis (HCC)    Chronic back pain    Chronic bladder pain    Depression    Gastritis    GERD (gastroesophageal reflux disease)    History of Clostridium difficile infection    recurrent   History of condyloma acuminatum    vulvar 2000   History of kidney stones 2012   IC (interstitial cystitis)    Insomnia    Postlaminectomy syndrome of cervical region    Sensation of pressure in bladder area    Wears glasses     Past Surgical History:  Procedure  Laterality Date   50 HOUR PH STUDY N/A 11/05/2016   Procedure: 24 HOUR [place];  Surgeon: Mauri Pole, MD;  Location: [facility];  Service: Endoscopy;  Laterality: N/A;   ANTERIOR CERVICAL DECOMP/DISCECTOMY FUSION  2015   C5 -- C7   BREAST ENHANCEMENT SURGERY Bilateral 2010   CYSTO WITH HYDRODISTENSION N/A 07/02/2016   Procedure: CYSTOSCOPY/HYDRODISTENSION;  Surgeon: Carolan Clines, MD;  Location: [facility];  Service: Urology;  Laterality: N/A;   DILATION AND CURETTAGE OF UTERUS     w/ suction for products of conception post partum   DILITATION & CURRETTAGE/HYSTROSCOPY WITH NOVASURE ABLATION  04/04/2012   Procedure: DILATATION & CURETTAGE/HYSTEROSCOPY WITH NOVASURE ABLATION;  Surgeon: Cheri Fowler, MD;  Location: [facility];  Service: Gynecology;  Laterality: N/A;   ESOPHAGEAL MANOMETRY N/A 11/05/2016   Procedure: ESOPHAGEAL MANOMETRY (EM);  Surgeon: Mauri Pole, MD;  Location: [facility];  Service: Endoscopy;  Laterality: N/A;   ESOPHAGOGASTRODUODENOSCOPY  last one 06-21-2016    Social History   Tobacco Use   Smoking status: Former    Packs/day: 0.75    Years: 7.00    Total pack years: 5.25    Types: Cigarettes    Quit date: 04/03/1987    Years since quitting: 34.7   Smokeless tobacco: Never  Vaping Use   Vaping Use: Never used  Substance Use Topics   Alcohol use: Yes    Alcohol/week: 0.0  standard drinks of alcohol    Comment: socially,maybe weekly   Drug use: No    Family History  Problem Relation Age of Onset   Ovarian cancer Mother    Testicular cancer Father    Hypertension Father    Cancer Maternal Grandmother        bone primary   Cancer Paternal Grandmother        breast with lung mets   Colon cancer Neg Hx    Stomach cancer Neg Hx    Esophageal cancer Neg Hx     Allergies  Allergen Reactions   Iodinated Contrast Media Shortness Of Breath    Respiratory distress    Erythromycin Nausea And Vomiting        Moxifloxacin  Nausea And Vomiting         Medication list has been reviewed and updated.  Current Outpatient Medications on File Prior to Visit  Medication Sig Dispense Refill   ALPRAZolam (XANAX) 0.5 MG tablet Take 0.5 mg by mouth as needed.     esomeprazole (NEXIUM) 40 MG capsule Take 40 mg by mouth 2 (two) times daily.     estradiol (ESTRACE) 0.1 MG/GM vaginal cream I 1 GRAM VAGINALLY 2 TIMES A WK  12   FLUoxetine (PROZAC) 10 MG capsule Take 1 capsule (10 mg total) by mouth 3 (three) times daily. 270 capsule 3   saccharomyces boulardii (FLORASTOR) 250 MG capsule Take 250 mg by mouth 2 (two) times daily.     zolpidem (AMBIEN) 10 MG tablet Take 0.5-1 tablets by mouth at bedtime.      No current facility-administered medications on file prior to visit.    Review of Systems:  As per HPI- otherwise negative.   Physical Examination: There were no vitals filed for this visit. There were no vitals filed for this visit. There is no height or weight on file to calculate BMI. Ideal Body Weight:    GEN: no acute distress. HEENT: Atraumatic, Normocephalic.  Ears and Nose: No external deformity. CV: RRR, No M/G/R. No JVD. No thrill. No extra heart sounds. PULM: CTA B, no wheezes, crackles, rhonchi. No retractions. No resp. distress. No accessory muscle use. ABD: S, NT, ND, +BS. No rebound. No HSM. EXTR: No c/c/e PSYCH: Normally interactive. Conversant.    Assessment and Plan: *** Physical exam today.  Encouraged healthy diet and exercise routine Will plan further follow- up pending labs.  Signed Abbe Amsterdam, MD

## 2022-01-01 ENCOUNTER — Encounter: Payer: Self-pay | Admitting: Family Medicine

## 2022-01-01 ENCOUNTER — Ambulatory Visit (INDEPENDENT_AMBULATORY_CARE_PROVIDER_SITE_OTHER): Payer: BC Managed Care – PPO | Admitting: Family Medicine

## 2022-01-01 VITALS — BP 110/70 | HR 65 | Temp 97.9°F | Ht 67.0 in | Wt 144.2 lb

## 2022-01-01 DIAGNOSIS — Z23 Encounter for immunization: Secondary | ICD-10-CM

## 2022-01-01 DIAGNOSIS — Z13220 Encounter for screening for lipoid disorders: Secondary | ICD-10-CM | POA: Diagnosis not present

## 2022-01-01 DIAGNOSIS — R5383 Other fatigue: Secondary | ICD-10-CM | POA: Diagnosis not present

## 2022-01-01 DIAGNOSIS — F411 Generalized anxiety disorder: Secondary | ICD-10-CM

## 2022-01-01 DIAGNOSIS — Z1329 Encounter for screening for other suspected endocrine disorder: Secondary | ICD-10-CM

## 2022-01-01 DIAGNOSIS — Z131 Encounter for screening for diabetes mellitus: Secondary | ICD-10-CM | POA: Diagnosis not present

## 2022-01-01 DIAGNOSIS — Z130 Encounter for screening for diseases of the blood and blood-forming organs and certain disorders involving the immune mechanism: Secondary | ICD-10-CM

## 2022-01-01 DIAGNOSIS — Z0000 Encounter for general adult medical examination without abnormal findings: Secondary | ICD-10-CM | POA: Diagnosis not present

## 2022-01-01 DIAGNOSIS — Z1211 Encounter for screening for malignant neoplasm of colon: Secondary | ICD-10-CM

## 2022-01-01 LAB — COMPREHENSIVE METABOLIC PANEL
ALT: 9 U/L (ref 0–35)
AST: 19 U/L (ref 0–37)
Albumin: 4.3 g/dL (ref 3.5–5.2)
Alkaline Phosphatase: 58 U/L (ref 39–117)
BUN: 11 mg/dL (ref 6–23)
CO2: 28 mEq/L (ref 19–32)
Calcium: 9.3 mg/dL (ref 8.4–10.5)
Chloride: 101 mEq/L (ref 96–112)
Creatinine, Ser: 0.73 mg/dL (ref 0.40–1.20)
GFR: 91.52 mL/min (ref >60.00)
Glucose, Bld: 77 mg/dL (ref 70–99)
Potassium: 3.6 mEq/L (ref 3.5–5.1)
Sodium: 138 mEq/L (ref 135–145)
Total Bilirubin: 0.6 mg/dL (ref 0.2–1.2)
Total Protein: 6.6 g/dL (ref 6.0–8.3)

## 2022-01-01 LAB — LIPID PANEL
Cholesterol: 205 mg/dL — ABNORMAL HIGH (ref 0–200)
HDL: 93.6 mg/dL (ref >39.00)
LDL Cholesterol: 88 mg/dL (ref 0–99)
NonHDL: 111.23
Total CHOL/HDL Ratio: 2
Triglycerides: 115 mg/dL (ref 0.0–149.0)
VLDL: 23 mg/dL (ref 0.0–40.0)

## 2022-01-01 LAB — CBC
HCT: 39.7 % (ref 36.0–46.0)
Hemoglobin: 13.7 g/dL (ref 12.0–15.0)
MCHC: 34.5 g/dL (ref 30.0–36.0)
MCV: 101.5 fl — ABNORMAL HIGH (ref 78.0–100.0)
Platelets: 178 10*3/uL (ref 150.0–400.0)
RBC: 3.92 Mil/uL (ref 3.87–5.11)
RDW: 13.2 % (ref 11.5–15.5)
WBC: 4.2 10*3/uL (ref 4.0–10.5)

## 2022-01-01 LAB — TSH: TSH: 1.26 u[IU]/mL (ref 0.35–5.50)

## 2022-01-01 LAB — HEMOGLOBIN A1C: Hgb A1c MFr Bld: 4.7 % (ref 4.6–6.5)

## 2022-01-02 ENCOUNTER — Encounter: Payer: Self-pay | Admitting: Family Medicine

## 2022-01-02 ENCOUNTER — Other Ambulatory Visit (INDEPENDENT_AMBULATORY_CARE_PROVIDER_SITE_OTHER): Payer: BC Managed Care – PPO

## 2022-01-02 DIAGNOSIS — D7589 Other specified diseases of blood and blood-forming organs: Secondary | ICD-10-CM | POA: Diagnosis not present

## 2022-01-02 LAB — B12 AND FOLATE PANEL
Folate: >23.9 ng/mL (ref >5.9)
Vitamin B-12: 396 pg/mL (ref 211–911)

## 2022-02-27 NOTE — Patient Instructions (Incomplete)
It was good to see you again today, hope you are feeling better soon  Recommend the latest COVID-19 booster if not done already

## 2022-02-27 NOTE — Progress Notes (Unsigned)
[place] [facility] [address] [place], [place] [address] [phone]  Date:  02/28/2022   Name:  Anne [name]   DOB:  May 25, 1965   MRN:  [id number]  PCP:  Darreld Mclean, MD    Chief Complaint: URI (Pt c/o: Sore Throat, Chest Congestion neg covid test on Monday. Taking Tylenol and Mucinex. )   History of Present Illness:  Anne [name] is a 56 y.o. very pleasant female patient who presents with the following:  Patient seen today with concern of illness Most recent visit with myself was for her physical at the start of October History of anxiety, panic attacks, cervical spine surgery, scoliosis  Lab work at that time overall reassuring, mild macrocytosis-B12 and folate normal  I referred her for colonoscopy at her most recent visit Recommend latest COVID-19 Mammogram-done per GYN  Sx started about 5 days ago with congestion and sore throat No fever She is coughing some- can be productive  She had some diarrhea but only due to eating popcorn- no vomiting She did test for covid 2 days ago  Sick contact at work-   Flu shot done  ST is better now but her voice is hoarse Patient Active Problem List   Diagnosis Date Noted   Hoarseness    Gastroesophageal reflux disease 10/26/2016   Globus sensation 10/26/2016   Other chest pain 10/26/2016   Dyspnea on exertion 08/09/2016   Cervical post-laminectomy syndrome 04/25/2016   H/O Clostridium difficile infection 08/11/2015   Barrett's esophagus 08/18/2013   Scoliosis of lumbosacral spine 05/04/2013   Menorrhagia 04/04/2012   Eustachian tube dysfunction 01/10/2012   DEPRESSIVE DISORDER 06/01/2009   CEPHALGIA 08/27/2008    Past Medical History:  Diagnosis Date   Anxiety    Arthritis    Barrett's esophagus    Cervical pseudoarthrosis (HCC)    Chronic back pain    Chronic bladder pain    Depression    Gastritis    GERD (gastroesophageal reflux  disease)    History of Clostridium difficile infection    recurrent   History of condyloma acuminatum    vulvar 2000   History of kidney stones 2012   IC (interstitial cystitis)    Insomnia    Postlaminectomy syndrome of cervical region    Sensation of pressure in bladder area    Wears glasses     Past Surgical History:  Procedure Laterality Date   75 HOUR [place] STUDY N/A 11/05/2016   Procedure: 24 HOUR [place];  Surgeon: Mauri Pole, MD;  Location: [facility];  Service: Endoscopy;  Laterality: N/A;   ANTERIOR CERVICAL DECOMP/DISCECTOMY FUSION  2015   C5 -- C7   BREAST ENHANCEMENT SURGERY Bilateral 2010   CYSTO WITH HYDRODISTENSION N/A 07/02/2016   Procedure: CYSTOSCOPY/HYDRODISTENSION;  Surgeon: Carolan Clines, MD;  Location[facility];  Service: Urology;  Laterality: N/A;   DILATION AND CURETTAGE OF UTERUS     w/ suction for products of conception post partum   DILITATION & CURRETTAGE/HYSTROSCOPY WITH NOVASURE ABLATION  04/04/2012   Procedure: DILATATION & CURETTAGE/HYSTEROSCOPY WITH NOVASURE ABLATION;  Surgeon: Cheri Fowler, MD;  Location: [facility];  Service: Gynecology;  Laterality: N/A;   ESOPHAGEAL MANOMETRY N/A 11/05/2016   Procedure: ESOPHAGEAL MANOMETRY (EM);  Surgeon: Mauri Pole, MD;  Location: [facility];  Service: Endoscopy;  Laterality: N/A;   ESOPHAGOGASTRODUODENOSCOPY  last one 06-21-2016    Social History   Tobacco  Use   Smoking status: Former    Packs/day: 0.75    Years: 7.00    Total pack years: 5.25    Types: Cigarettes    Quit date: 04/03/1987    Years since quitting: 34.9   Smokeless tobacco: Never  Vaping Use   Vaping Use: Never used  Substance Use Topics   Alcohol use: Yes    Alcohol/week: 0.0 standard drinks of alcohol    Comment: socially,maybe weekly   Drug use: No    Family History  Problem Relation Age of Onset   Ovarian cancer Mother    Testicular cancer Father    Hypertension Father    Cancer Maternal  Grandmother        bone primary   Cancer Paternal Grandmother        breast with lung mets   Colon cancer Neg Hx    Stomach cancer Neg Hx    Esophageal cancer Neg Hx     Allergies  Allergen Reactions   Iodinated Contrast Media Shortness Of Breath    Respiratory distress    Erythromycin Nausea And Vomiting        Moxifloxacin Nausea And Vomiting         Medication list has been reviewed and updated.  Current Outpatient Medications on File Prior to Visit  Medication Sig Dispense Refill   ALPRAZolam (XANAX) 0.5 MG tablet Take 0.5 mg by mouth as needed.     esomeprazole (NEXIUM) 40 MG capsule Take 40 mg by mouth 2 (two) times daily.     estradiol (VIVELLE-DOT) 0.075 MG/24HR APPLY 1 PATCH TOPICALLY TO THE SKIN 2 TIMES A WEEK     FLUoxetine (PROZAC) 10 MG capsule Take 1 capsule (10 mg total) by mouth 3 (three) times daily. 270 capsule 3   progesterone (PROMETRIUM) 100 MG capsule Take 1 tablet by mouth daily.     saccharomyces boulardii (FLORASTOR) 250 MG capsule Take 250 mg by mouth 2 (two) times daily.     zolpidem (AMBIEN) 10 MG tablet Take 0.5-1 tablets by mouth at bedtime.      No current facility-administered medications on file prior to visit.    Review of Systems:  As per HPI- otherwise negative.   Physical Examination: Vitals:   02/28/22 1605  BP: 110/62  Pulse: 71  Resp: 18  Temp: 97.8 F (36.6 C)  SpO2: 97%   Vitals:   02/28/22 1605  Weight: 144 lb 9.6 oz (65.6 kg)  Height: 5\' 7"  (1.702 m)   Body mass index is 22.65 kg/m. Ideal Body Weight: Weight in (lb) to have BMI = 25: 159.3  GEN: no acute distress.  Appears fatigued, nontoxic HEENT: Atraumatic, Normocephalic.  Bilateral TM wnl, oropharynx erythematous but no exudate PEERL,EOMI.   Ears and Nose: No external deformity. CV: RRR, No M/G/R. No JVD. No thrill. No extra heart sounds. PULM: CTA B, no wheezes, crackles, rhonchi. No retractions. No resp. distress. No accessory muscle use. ABD: S, NT,  ND EXTR: No c/c/e PSYCH: Normally interactive. Conversant.   Results for orders placed or performed in visit on 02/28/22  POCT Influenza A/B  Result Value Ref Range   Influenza A, POC Negative Negative   Influenza B, POC Negative Negative  POCT rapid strep A  Result Value Ref Range   Rapid Strep A Screen Negative Negative  POC COVID-19 BinaxNow  Result Value Ref Range   SARS Coronavirus 2 Ag Negative Negative    Assessment and Plan: Acute cough - Plan: POCT Influenza  A/B, POC COVID-19 BinaxNow, doxycycline (VIBRAMYCIN) 100 MG capsule, HYDROcodone bit-homatropine (HYCODAN) 5-1.5 MG/5ML syrup  Pharyngitis, unspecified etiology - Plan: POCT rapid strep A, doxycycline (VIBRAMYCIN) 100 MG capsule, predniSONE (DELTASONE) 20 MG tablet  Patient seen today with concern of illness for 5 or 6 days.  She is negative for flu, strep, COVID.  She has a big court trial next week and would like to do anything possible to start feeling better.  Decided to start her on doxycycline, provided Hycodan to use as needed for cough, prednisone for 6 days.  Reminded that Hycodan is sedating, do not drive on this medication or combine with Ambien  She will let me know if not feeling better in the next few days  Signed Abbe Amsterdam, MD

## 2022-02-28 ENCOUNTER — Ambulatory Visit: Payer: BC Managed Care – PPO | Admitting: Family Medicine

## 2022-02-28 VITALS — BP 110/62 | HR 71 | Temp 97.8°F | Resp 18 | Ht 67.0 in | Wt 144.6 lb

## 2022-02-28 DIAGNOSIS — R051 Acute cough: Secondary | ICD-10-CM

## 2022-02-28 DIAGNOSIS — J029 Acute pharyngitis, unspecified: Secondary | ICD-10-CM

## 2022-02-28 LAB — POC COVID19 BINAXNOW: SARS Coronavirus 2 Ag: NEGATIVE

## 2022-02-28 LAB — POCT INFLUENZA A/B
Influenza A, POC: NEGATIVE
Influenza B, POC: NEGATIVE

## 2022-02-28 LAB — POCT RAPID STREP A (OFFICE): Rapid Strep A Screen: NEGATIVE

## 2022-02-28 MED ORDER — DOXYCYCLINE HYCLATE 100 MG PO CAPS: 100.0000 mg | ORAL_CAPSULE | Freq: Two times a day (BID) | ORAL | 0 refills | Status: DC

## 2022-02-28 MED ORDER — PREDNISONE 20 MG PO TABS: ORAL_TABLET | ORAL | 0 refills | Status: DC

## 2022-02-28 MED ORDER — HYDROCODONE BIT-HOMATROP MBR 5-1.5 MG/5ML PO SOLN: 5.0000 mL | Freq: Three times a day (TID) | ORAL | 0 refills | Status: AC | PRN

## 2022-04-10 ENCOUNTER — Encounter: Payer: Self-pay | Admitting: Family Medicine

## 2022-06-25 ENCOUNTER — Ambulatory Visit: Payer: BC Managed Care – PPO | Admitting: Family Medicine

## 2022-07-09 ENCOUNTER — Encounter: Payer: Self-pay | Admitting: Family Medicine

## 2022-08-15 ENCOUNTER — Encounter (INDEPENDENT_AMBULATORY_CARE_PROVIDER_SITE_OTHER): Payer: BC Managed Care – PPO | Admitting: Family Medicine

## 2022-08-15 DIAGNOSIS — F411 Generalized anxiety disorder: Secondary | ICD-10-CM

## 2022-08-17 MED ORDER — FLUOXETINE HCL 20 MG PO CAPS: 60.0000 mg | ORAL_CAPSULE | Freq: Every day | ORAL | 3 refills | Status: DC

## 2022-08-17 NOTE — Telephone Encounter (Signed)
Please see the MyChart message reply(ies) for my assessment and plan.  The patient gave consent for this Medical Advice Message and is aware that it may result in a bill to their insurance company as well as the possibility that this may result in a co-payment or deductible. They are an established patient, but are not seeking medical advice exclusively about a problem treated during an in person or video visit in the last 7 days. I did not recommend an in person or video visit within 7 days of my reply.  I spent a total of 15 minutes cumulative time within 7 days through MyChart messaging [place]a Retana, MD  

## 2022-08-17 NOTE — Addendum Note (Signed)
Addended by: Abbe Amsterdam C on: 08/17/2022 06:25 AM   Modules accepted: Orders

## 2022-08-22 ENCOUNTER — Ambulatory Visit: Payer: BC Managed Care – PPO | Admitting: Family Medicine

## 2022-08-22 ENCOUNTER — Other Ambulatory Visit (HOSPITAL_COMMUNITY)
Admission: RE | Admit: 2022-08-22 | Discharge: 2022-08-22 | Disposition: A | Discharge status: Home / Self Care | Payer: BC Managed Care – PPO | Source: Ambulatory Visit | Attending: Family Medicine | Admitting: Family Medicine

## 2022-08-22 VITALS — BP 122/70 | HR 81 | Temp 98.1°F | Resp 18 | Ht 67.0 in | Wt 142.8 lb

## 2022-08-22 DIAGNOSIS — Z1211 Encounter for screening for malignant neoplasm of colon: Secondary | ICD-10-CM

## 2022-08-22 DIAGNOSIS — N898 Other specified noninflammatory disorders of vagina: Secondary | ICD-10-CM | POA: Insufficient documentation

## 2022-08-22 MED ORDER — FLUCONAZOLE 150 MG PO TABS: 150.0000 mg | ORAL_TABLET | Freq: Once | ORAL | 0 refills | Status: AC

## 2022-08-22 NOTE — Progress Notes (Signed)
[facility] [address] [place], [place] [address] [phone]  Date:  08/22/2022   Name:  Anne [name]   DOB:  [birth year]   MRN:  [id number]  PCP:  Pearline Cables, MD    Chief Complaint: Follow-up (Concerns/ questions: follow up on stress- taking Fluoxetine 60 mg. 2. Pt thinks she might have a yeast infection but has also had new partners. [name]: none in ncir/Cologuard: Colonoscopy due)   History of Present Illness:  Anne [name] is a 57 y.o. very pleasant female patient who presents with the following:  Patient seen today to discuss a few concerns History of anxiety, panic attacks, cervical spine surgery, scoliosis  Most recent visit with myself was in November when she was dealing with a cough  She contacted me on April 8 with the update that she is having a lot of problems in her marriage, had increased her dose of fluoxetine which did seem to be helping and also was seeing a counselor She is currently taking a total of 60 mg of fluoxetine daily She notes she is actually closed on a house tomorrow, they are moving forward with the divorce-overall she thinks this is a good thing  Mammogram-per GYN, can get records Colon cancer screening/Cologuard overdue-most recent completed 05/23/2015-I referred for colonoscopy last fall-however it looks like she had just an upper endoscopy in July of last year.  Patient notes no family history of colon cancer, she would like to do a repeat Cologuard  She has noted sx of vaginal discharge and itching No burning or pain, no unusual bleeding   She will see her GYN in August for her pap   Patient Active Problem List   Diagnosis Date Noted   Hoarseness    Gastroesophageal reflux disease 10/26/2016   Globus sensation 10/26/2016   Other chest pain 10/26/2016   Dyspnea on exertion 08/09/2016   Cervical post-laminectomy syndrome 04/25/2016   H/O Clostridium  difficile infection 08/11/2015   Barrett's esophagus 08/18/2013   Scoliosis of lumbosacral spine 05/04/2013   Menorrhagia 04/04/2012   Eustachian tube dysfunction 01/10/2012   DEPRESSIVE DISORDER 06/01/2009   CEPHALGIA 08/27/2008    Past Medical History:  Diagnosis Date   Anxiety    Arthritis    Barrett's esophagus    Cervical pseudoarthrosis (HCC)    Chronic back pain    Chronic bladder pain    Depression    Gastritis    GERD (gastroesophageal reflux disease)    History of Clostridium difficile infection    recurrent   History of condyloma acuminatum    vulvar 2000   History of kidney stones 2012   IC (interstitial cystitis)    Insomnia    Postlaminectomy syndrome of cervical region    Sensation of pressure in bladder area    Wears glasses     Past Surgical History:  Procedure Laterality Date   90 HOUR [place] STUDY N/A 11/05/2016   Procedure: 24 HOUR [place] STUDY;  Surgeon: Napoleon Form, MD;  Location: [facility];  Service: Endoscopy;  Laterality: N/A;   ANTERIOR CERVICAL DECOMP/DISCECTOMY FUSION  2015   C5 -- C7   BREAST ENHANCEMENT SURGERY Bilateral 2010   CYSTO WITH HYDRODISTENSION N/A 07/02/2016   Procedure: CYSTOSCOPY/HYDRODISTENSION;  Surgeon: Jethro Bolus, MD;  Location: [facility];  Service: Urology;  Laterality: N/A;   DILATION AND CURETTAGE OF UTERUS     w/ suction for products  of conception post partum   DILITATION & CURRETTAGE/HYSTROSCOPY WITH NOVASURE ABLATION  04/04/2012   Procedure: DILATATION & CURETTAGE/HYSTEROSCOPY WITH NOVASURE ABLATION;  Surgeon: Lavina Hamman, MD;  Location: [facility];  Service: Gynecology;  Laterality: N/A;   ESOPHAGEAL MANOMETRY N/A 11/05/2016   Procedure: ESOPHAGEAL MANOMETRY (EM);  Surgeon: Napoleon Form, MD;  Location: [facility];  Service: Endoscopy;  Laterality: N/A;   ESOPHAGOGASTRODUODENOSCOPY  last one 06-21-2016    Social History   Tobacco Use   Smoking status: Former    Packs/day: 0.75     Years: 7.00    Additional pack years: 0.00    Total pack years: 5.25    Types: Cigarettes    Quit date: 04/03/1987    Years since quitting: 35.4   Smokeless tobacco: Never  Vaping Use   Vaping Use: Never used  Substance Use Topics   Alcohol use: Yes    Alcohol/week: 0.0 standard drinks of alcohol    Comment: socially,maybe weekly   Drug use: No    Family History  Problem Relation Age of Onset   Ovarian cancer Mother    Testicular cancer Father    Hypertension Father    Cancer Maternal Grandmother        bone primary   Cancer Paternal Grandmother        breast with lung mets   Colon cancer Neg Hx    Stomach cancer Neg Hx    Esophageal cancer Neg Hx     Allergies  Allergen Reactions   Iodinated Contrast Media Shortness Of Breath    Respiratory distress    Erythromycin Nausea And Vomiting        Moxifloxacin Nausea And Vomiting         Medication list has been reviewed and updated.  Current Outpatient Medications on File Prior to Visit  Medication Sig Dispense Refill   ALPRAZolam (XANAX) 0.5 MG tablet Take 0.5 mg by mouth as needed.     esomeprazole (NEXIUM) 40 MG capsule Take 40 mg by mouth 2 (two) times daily.     estradiol (VIVELLE-DOT) 0.075 MG/24HR APPLY 1 PATCH TOPICALLY TO THE SKIN 2 TIMES A WEEK     FLUoxetine (PROZAC) 20 MG capsule Take 3 capsules (60 mg total) by mouth daily. 270 capsule 3   progesterone (PROMETRIUM) 100 MG capsule Take 1 tablet by mouth daily.     saccharomyces boulardii (FLORASTOR) 250 MG capsule Take 250 mg by mouth 2 (two) times daily.     zolpidem (AMBIEN) 10 MG tablet Take 0.5-1 tablets by mouth at bedtime.      No current facility-administered medications on file prior to visit.    Review of Systems:  As per HPI- otherwise negative.   Physical Examination: Vitals:   08/22/22 1458  BP: 122/70  Pulse: 81  Resp: 18  Temp: 98.1 F (36.7 C)  SpO2: 97%   Vitals:   08/22/22 1458  Weight: 142 lb 12.8 oz (64.8 kg)  Height:  5\' 7"  (1.702 m)   Body mass index is 22.37 kg/m. Ideal Body Weight: Weight in (lb) to have BMI = 25: 159.3  GEN: no acute distress.  Normal weight, looks well HEENT: Atraumatic, Normocephalic.  Ears and Nose: No external deformity. CV: RRR, No M/G/R. No JVD. No thrill. No extra heart sounds. PULM: CTA B, no wheezes, crackles, rhonchi. No retractions. No resp. distress. No accessory muscle use. EXTR: No c/c/e PSYCH: Normally interactive. Conversant.  Vaginal exam normal - no abnormal discharge or lesions noted  Assessment and Plan: Screening for colon cancer - Plan: Cologuard  Vaginal itching - Plan: Cervicovaginal ancillary only( [facility]), fluconazole (DIFLUCAN) 150 MG tablet  Patient seen today with concern of vaginal itching.  She is in the process of a divorce, would also like screening for STI but declines blood work Blood test results pending will treat for presumed yeast vaginitis with Diflucan She would like to be screened for colon cancer using Cologuard  Signed Abbe Amsterdam, MD

## 2022-08-22 NOTE — Patient Instructions (Addendum)
It was good to see you today but I am sorry you are under so much stress Cologuard kit will come to you I will be in touch with your labs asap Can take diflucan for likely yeast infection  Recommend the shingles vaccine at your convenience

## 2022-08-24 ENCOUNTER — Encounter: Payer: Self-pay | Admitting: Family Medicine

## 2022-08-24 LAB — CERVICOVAGINAL ANCILLARY ONLY
Bacterial Vaginitis (gardnerella): NEGATIVE
Candida Glabrata: NEGATIVE
Candida Vaginitis: POSITIVE — AB
Chlamydia: NEGATIVE
Comment: NEGATIVE
Comment: NEGATIVE
Comment: NEGATIVE
Comment: NEGATIVE
Comment: NEGATIVE
Comment: NORMAL
Neisseria Gonorrhea: NEGATIVE
Trichomonas: NEGATIVE

## 2022-08-30 ENCOUNTER — Encounter: Payer: Self-pay | Admitting: Family Medicine

## 2022-08-30 MED ORDER — FLUCONAZOLE 150 MG PO TABS: 150.0000 mg | ORAL_TABLET | Freq: Once | ORAL | 0 refills | Status: AC

## 2022-11-28 ENCOUNTER — Encounter: Payer: Self-pay | Admitting: Family Medicine

## 2022-11-28 DIAGNOSIS — F411 Generalized anxiety disorder: Secondary | ICD-10-CM

## 2022-11-28 MED ORDER — FLUOXETINE HCL 20 MG PO CAPS: 60.0000 mg | ORAL_CAPSULE | Freq: Every day | ORAL | 0 refills | Status: DC

## 2023-01-08 ENCOUNTER — Other Ambulatory Visit: Payer: Self-pay | Admitting: Obstetrics and Gynecology

## 2023-01-08 DIAGNOSIS — R928 Other abnormal and inconclusive findings on diagnostic imaging of breast: Secondary | ICD-10-CM

## 2023-01-28 ENCOUNTER — Ambulatory Visit
Admission: RE | Admit: 2023-01-28 | Discharge: 2023-01-28 | Disposition: A | Discharge status: Home / Self Care | Payer: BC Managed Care – PPO | Source: Ambulatory Visit | Attending: Obstetrics and Gynecology | Admitting: Obstetrics and Gynecology

## 2023-01-28 ENCOUNTER — Ambulatory Visit: Payer: BC Managed Care – PPO

## 2023-01-28 DIAGNOSIS — R928 Other abnormal and inconclusive findings on diagnostic imaging of breast: Secondary | ICD-10-CM

## 2023-01-31 ENCOUNTER — Ambulatory Visit: Payer: BC Managed Care – PPO | Admitting: Family Medicine

## 2023-01-31 ENCOUNTER — Encounter: Payer: Self-pay | Admitting: Family Medicine

## 2023-01-31 NOTE — Progress Notes (Deleted)
[facility] at [facility] [address], [address] [place], [place] [address] [phone]  Date:  01/31/2023   Name:  Anne [name]   DOB:  09-Aug-1965   MRN:  [id number]  PCP:  Pearline Cables, MD    Chief Complaint: No chief complaint on file.   History of Present Illness:  Anne [name] is a 57 y.o. very pleasant female patient who presents with the following:  Patient seen today with a concern involving her right leg Most recent visit with myself was in May; at that time she was moving forward with a divorce and feeling somewhat more settled History of anxiety, panic attacks, cervical spine surgery, scoliosis   Patient Active Problem List   Diagnosis Date Noted   Hoarseness    Gastroesophageal reflux disease 10/26/2016   Globus sensation 10/26/2016   Other chest pain 10/26/2016   Dyspnea on exertion 08/09/2016   Cervical post-laminectomy syndrome 04/25/2016   H/O Clostridium difficile infection 08/11/2015   Barrett's esophagus 08/18/2013   Scoliosis of lumbosacral spine 05/04/2013   Menorrhagia 04/04/2012   Eustachian tube dysfunction 01/10/2012   DEPRESSIVE DISORDER 06/01/2009   Headache 08/27/2008    Past Medical History:  Diagnosis Date   Anxiety    Arthritis    Barrett's esophagus    Cervical pseudoarthrosis (HCC)    Chronic back pain    Chronic bladder pain    Depression    Gastritis    GERD (gastroesophageal reflux disease)    History of Clostridium difficile infection    recurrent   History of condyloma acuminatum    vulvar 2000   History of kidney stones 2012   IC (interstitial cystitis)    Insomnia    Postlaminectomy syndrome of cervical region    Sensation of pressure in bladder area    Wears glasses     Past Surgical History:  Procedure Laterality Date   68 HOUR PH STUDY N/A 11/05/2016   Procedure: 24 HOUR [place] STUDY;  Surgeon: Napoleon Form, MD;  Location: [facility];  Service:  Endoscopy;  Laterality: N/A;   ANTERIOR CERVICAL DECOMP/DISCECTOMY FUSION  2015   C5 -- C7   BREAST ENHANCEMENT SURGERY Bilateral 2010   CYSTO WITH HYDRODISTENSION N/A 07/02/2016   Procedure: CYSTOSCOPY/HYDRODISTENSION;  Surgeon: Jethro Bolus, MD;  Location: [facility];  Service: Urology;  Laterality: N/A;   DILATION AND CURETTAGE OF UTERUS     w/ suction for products of conception post partum   DILITATION & CURRETTAGE/HYSTROSCOPY WITH NOVASURE ABLATION  04/04/2012   Procedure: DILATATION & CURETTAGE/HYSTEROSCOPY WITH NOVASURE ABLATION;  Surgeon: Lavina Hamman, MD;  Location: [facility];  Service: Gynecology;  Laterality: N/A;   ESOPHAGEAL MANOMETRY N/A 11/05/2016   Procedure: ESOPHAGEAL MANOMETRY (EM);  Surgeon: Napoleon Form, MD;  Location: [facility];  Service: Endoscopy;  Laterality: N/A;   ESOPHAGOGASTRODUODENOSCOPY  last one 06-21-2016    Social History   Tobacco Use   Smoking status: Former    Current packs/day: 0.00    Average packs/day: 0.8 packs/day for 7.0 years (5.3 ttl pk-yrs)    Types: Cigarettes    Start date: 04/02/1980    Quit date: 04/03/1987    Years since quitting: 35.8   Smokeless tobacco: Never  Vaping Use   Vaping status: Never Used  Substance Use Topics   Alcohol use: Yes    Alcohol/week: 0.0 standard drinks of alcohol    Comment: socially,maybe weekly   Drug  use: No    Family History  Problem Relation Age of Onset   Ovarian cancer Mother    Testicular cancer Father    Hypertension Father    Cancer Maternal Grandmother        bone primary   Cancer Paternal Grandmother        breast with lung mets   Colon cancer Neg Hx    Stomach cancer Neg Hx    Esophageal cancer Neg Hx     Allergies  Allergen Reactions   Iodinated Contrast Media Shortness Of Breath    Respiratory distress    Erythromycin Nausea And Vomiting        Moxifloxacin Nausea And Vomiting         Medication list has been reviewed and updated.  Current  Outpatient Medications on File Prior to Visit  Medication Sig Dispense Refill   ALPRAZolam (XANAX) 0.5 MG tablet Take 0.5 mg by mouth as needed.     esomeprazole (NEXIUM) 40 MG capsule Take 40 mg by mouth 2 (two) times daily.     estradiol (VIVELLE-DOT) 0.075 MG/24HR APPLY 1 PATCH TOPICALLY TO THE SKIN 2 TIMES A WEEK     FLUoxetine (PROZAC) 20 MG capsule Take 3 capsules (60 mg total) by mouth daily. 15 capsule 0   progesterone (PROMETRIUM) 100 MG capsule Take 1 tablet by mouth daily.     saccharomyces boulardii (FLORASTOR) 250 MG capsule Take 250 mg by mouth 2 (two) times daily.     zolpidem (AMBIEN) 10 MG tablet Take 0.5-1 tablets by mouth at bedtime.      No current facility-administered medications on file prior to visit.    Review of Systems:  As per HPI- otherwise negative.   Physical Examination: There were no vitals filed for this visit. There were no vitals filed for this visit. There is no height or weight on file to calculate BMI. Ideal Body Weight:    GEN: no acute distress. HEENT: Atraumatic, Normocephalic.  Ears and Nose: No external deformity. CV: RRR, No M/G/R. No JVD. No thrill. No extra heart sounds. PULM: CTA B, no wheezes, crackles, rhonchi. No retractions. No resp. distress. No accessory muscle use. ABD: S, NT, ND, +BS. No rebound. No HSM. EXTR: No c/c/e PSYCH: Normally interactive. Conversant.    Assessment and Plan: ***  Signed Abbe Amsterdam, MD

## 2023-02-03 NOTE — Progress Notes (Unsigned)
[facility] [address] [place], [place] [address] [phone]  Date:  02/06/2023   Name:  Anne [name]   DOB:  [birth year]   MRN:  [id number]  PCP:  Pearline Cables, MD    Chief Complaint: Right leg issues (Numbness/ tingling. Has had c-spine repair. )   History of Present Illness:  Anne [name] is a 57 y.o. very pleasant female patient who presents with the following:  Patient seen today for concern of leg pain.  She was a no-show for this issue last week Most recent visit with myself was in May  She then asked the following MyChart message on 10/31 Hi Dr. Salena Saner, I have been experiencing tingling/sciatica symptoms in my right leg/hip for about 4 months. This happens every day and the leg is tingly for most of the day. I have just ignored it b/c of all the other stuff w/ nasty divorce. I have also had some pain in my lower back, mostly in the morning when I first get up. I had an MRI in 2015 and I think at that time there were some lower disc issues, but they were not that significant. I had to have surgery on my neck to install some hardware. I do not want to have surgery again and have been walking and doing yoga but the leg problem is persistent. What course of action do I need to take to find out if there is some sort of problem that I need to know about? See you, or go to an ortho for an x-ray, or something else?   Cologuard due for update- she has at her home, will complete soon Flu shot- give today COVID booster Shingrix- can start today.  History of chickenpox and previous episode of shingles  She has noted the above-mentioned numbness and tingling in her right leg and some pain in her hip for several months She moved houses due to divorce in the spring/ early summer- this is when her leg started bothering her It feels fine when she first wakes up but then pain and tingling begins as she gets moving  for the day No weakness, no bowel or bladder control changes She thinks she had a whole spine MRI in 2015 but is not quite sure, this was prior to her cervical spine surgery She has tried walking, yoga, stretching-these help somewhat  She does not notice back pain except for her neck- this is perhaps a bit worse but not new  Patient Active Problem List   Diagnosis Date Noted   Hoarseness    Gastroesophageal reflux disease 10/26/2016   Globus sensation 10/26/2016   Other chest pain 10/26/2016   Dyspnea on exertion 08/09/2016   Cervical post-laminectomy syndrome 04/25/2016   H/O Clostridium difficile infection 08/11/2015   Barrett's esophagus 08/18/2013   Scoliosis of lumbosacral spine 05/04/2013   Menorrhagia 04/04/2012   Eustachian tube dysfunction 01/10/2012   DEPRESSIVE DISORDER 06/01/2009   Headache 08/27/2008    Past Medical History:  Diagnosis Date   Anxiety    Arthritis    Barrett's esophagus    Cervical pseudoarthrosis (HCC)    Chronic back pain    Chronic bladder pain    Depression    Gastritis    GERD (gastroesophageal reflux disease)    History of Clostridium difficile infection    recurrent   History of condyloma acuminatum    vulvar 2000   History  of kidney stones 2012   IC (interstitial cystitis)    Insomnia    Postlaminectomy syndrome of cervical region    Sensation of pressure in bladder area    Wears glasses     Past Surgical History:  Procedure Laterality Date   3 HOUR PH STUDY N/A 11/05/2016   Procedure: 24 HOUR PH STUDY;  Surgeon: Napoleon Form, MD;  Location: [facility];  Service: Endoscopy;  Laterality: N/A;   ANTERIOR CERVICAL DECOMP/DISCECTOMY FUSION  2015   C5 -- C7   BREAST ENHANCEMENT SURGERY Bilateral 2010   CYSTO WITH HYDRODISTENSION N/A 07/02/2016   Procedure: CYSTOSCOPY/HYDRODISTENSION;  Surgeon: Jethro Bolus, MD;  Location: [facility];  Service: Urology;  Laterality: N/A;   DILATION AND CURETTAGE OF UTERUS      w/ suction for products of conception post partum   DILITATION & CURRETTAGE/HYSTROSCOPY WITH NOVASURE ABLATION  04/04/2012   Procedure: DILATATION & CURETTAGE/HYSTEROSCOPY WITH NOVASURE ABLATION;  Surgeon: Lavina Hamman, MD;  Location: [facility];  Service: Gynecology;  Laterality: N/A;   ESOPHAGEAL MANOMETRY N/A 11/05/2016   Procedure: ESOPHAGEAL MANOMETRY (EM);  Surgeon: Napoleon Form, MD;  Location: [facility];  Service: Endoscopy;  Laterality: N/A;   ESOPHAGOGASTRODUODENOSCOPY  last one 06-21-2016    Social History   Tobacco Use   Smoking status: Former    Current packs/day: 0.00    Average packs/day: 0.8 packs/day for 7.0 years (5.3 ttl pk-yrs)    Types: Cigarettes    Start date: 04/02/1980    Quit date: 04/03/1987    Years since quitting: 35.8   Smokeless tobacco: Never  Vaping Use   Vaping status: Never Used  Substance Use Topics   Alcohol use: Yes    Alcohol/week: 0.0 standard drinks of alcohol    Comment: socially,maybe weekly   Drug use: No    Family History  Problem Relation Age of Onset   Ovarian cancer Mother    Testicular cancer Father    Hypertension Father    Cancer Maternal Grandmother        bone primary   Cancer Paternal Grandmother        breast with lung mets   Colon cancer Neg Hx    Stomach cancer Neg Hx    Esophageal cancer Neg Hx     Allergies  Allergen Reactions   Iodinated Contrast Media Shortness Of Breath    Respiratory distress    Erythromycin Nausea And Vomiting        Moxifloxacin Nausea And Vomiting         Medication list has been reviewed and updated.  Current Outpatient Medications on File Prior to Visit  Medication Sig Dispense Refill   ALPRAZolam (XANAX) 0.5 MG tablet Take 0.5 mg by mouth as needed.     esomeprazole (NEXIUM) 40 MG capsule Take 40 mg by mouth 2 (two) times daily.     estradiol (VIVELLE-DOT) 0.075 MG/24HR APPLY 1 PATCH TOPICALLY TO THE SKIN 2 TIMES A WEEK     FLUoxetine (PROZAC) 20 MG capsule Take 3  capsules (60 mg total) by mouth daily. 15 capsule 0   progesterone (PROMETRIUM) 100 MG capsule Take 1 tablet by mouth daily.     saccharomyces boulardii (FLORASTOR) 250 MG capsule Take 250 mg by mouth 2 (two) times daily.     zolpidem (AMBIEN) 10 MG tablet Take 0.5-1 tablets by mouth at bedtime.      No current facility-administered medications on file prior to visit.    Review of  Systems:  As per HPI- otherwise negative.   Physical Examination: Vitals:   02/06/23 1523  BP: 118/72  Pulse: 72  Resp: 18  Temp: 98.2 F (36.8 C)  SpO2: 98%   Vitals:   02/06/23 1523  Weight: 149 lb (67.6 kg)  Height: 5\' 7"  (1.702 m)   Body mass index is 23.34 kg/m. Ideal Body Weight: Weight in (lb) to have BMI = 25: 159.3  GEN: no acute distress.  Normal weight, looks well HEENT: Atraumatic, Normocephalic.  Ears and Nose: No external deformity. CV: RRR, No M/G/R. No JVD. No thrill. No extra heart sounds. PULM: CTA B, no wheezes, crackles, rhonchi. No retractions. No resp. distress. No accessory muscle use. ABD: S, NT, ND, +BS. No rebound. No HSM. EXTR: No c/c/e PSYCH: Normally interactive. Conversant.  Tenderness over the right sciatic notch.  She is not tender over the greater trochanter of the hip.  Normal thoracolumbar flexion, extension, normal bilateral lower extremity strength, sensation, and deep tendon reflex  Assessment and Plan: Sciatica, right side - Plan: predniSONE (DELTASONE) 20 MG tablet  Immunization due - Plan: Zoster Recombinant (Shingrix ), Flu vaccine trivalent PF, 6mos and older(Flulaval,Afluria,Fluarix,Fluzone)  Patient seen today with likely sciatica for several months.  We discussed getting imaging, she would like to try treatment first and see if imaging is necessary.  Prescribed a 10-day course of prednisone, asked her to let me know how this works for her.    Flu shot, first dose of Shingrix today  Signed Abbe Amsterdam, MD

## 2023-02-06 ENCOUNTER — Ambulatory Visit: Payer: BC Managed Care – PPO | Admitting: Family Medicine

## 2023-02-06 VITALS — BP 118/72 | HR 72 | Temp 98.2°F | Resp 18 | Ht 67.0 in | Wt 149.0 lb

## 2023-02-06 DIAGNOSIS — M5431 Sciatica, right side: Secondary | ICD-10-CM

## 2023-02-06 DIAGNOSIS — Z23 Encounter for immunization: Secondary | ICD-10-CM

## 2023-02-06 MED ORDER — PREDNISONE 20 MG PO TABS: ORAL_TABLET | ORAL | 0 refills | Status: DC

## 2023-02-06 NOTE — Patient Instructions (Signed)
It was great to see again today, I hope your leg is feeling better very soon.  Wait about 48 hours after your vaccines, then start on the prednisone.  Take 2 pills daily for 5 days, then 1 pill daily for 5 days.  Typically best to take this medication in the morning.  Please let me know if this seems to resolve your issue.  If it fails to get rid of the symptoms or if they come back let me know, at that point we can get some imaging  Flu shot, first dose of shingles vaccine today You can get your second shingles vaccine in 2 to 6 months, either here or at your pharmacy

## 2023-02-14 ENCOUNTER — Encounter: Payer: Self-pay | Admitting: Family Medicine

## 2023-02-14 DIAGNOSIS — M5431 Sciatica, right side: Secondary | ICD-10-CM

## 2023-02-14 NOTE — Addendum Note (Signed)
Addended by: Abbe Amsterdam C on: 02/14/2023 06:06 PM   Modules accepted: Orders

## 2023-02-19 ENCOUNTER — Other Ambulatory Visit (HOSPITAL_COMMUNITY): Payer: BC Managed Care – PPO

## 2023-02-25 ENCOUNTER — Ambulatory Visit (HOSPITAL_COMMUNITY)
Admission: RE | Admit: 2023-02-25 | Discharge: 2023-02-25 | Disposition: A | Discharge status: Home / Self Care | Payer: BC Managed Care – PPO | Source: Ambulatory Visit | Attending: Family Medicine | Admitting: Family Medicine

## 2023-02-25 DIAGNOSIS — M5431 Sciatica, right side: Secondary | ICD-10-CM | POA: Insufficient documentation

## 2023-03-04 ENCOUNTER — Other Ambulatory Visit (HOSPITAL_COMMUNITY): Payer: Self-pay

## 2023-03-04 ENCOUNTER — Encounter: Payer: Self-pay | Admitting: Family Medicine

## 2023-03-04 ENCOUNTER — Other Ambulatory Visit: Payer: Self-pay

## 2023-03-04 ENCOUNTER — Other Ambulatory Visit: Payer: Self-pay | Admitting: Family Medicine

## 2023-03-04 DIAGNOSIS — F411 Generalized anxiety disorder: Secondary | ICD-10-CM

## 2023-03-04 DIAGNOSIS — M5431 Sciatica, right side: Secondary | ICD-10-CM

## 2023-03-04 DIAGNOSIS — M51361 Other intervertebral disc degeneration, lumbar region with lower extremity pain only: Secondary | ICD-10-CM

## 2023-03-04 MED ORDER — FLUOXETINE HCL 20 MG PO CAPS
60.0000 mg | ORAL_CAPSULE | Freq: Every day | ORAL | 0 refills | Status: DC
  Filled 2023-03-04: qty 15, 5d supply, fill #0

## 2023-03-04 MED ORDER — FLUOXETINE HCL 20 MG PO CAPS: 60.0000 mg | ORAL_CAPSULE | Freq: Every day | ORAL | 3 refills | Status: DC

## 2023-03-04 MED ORDER — FLUOXETINE HCL 20 MG PO CAPS: 60.0000 mg | ORAL_CAPSULE | Freq: Every day | ORAL | 0 refills | Status: DC

## 2023-03-05 ENCOUNTER — Other Ambulatory Visit: Payer: Self-pay

## 2023-03-07 ENCOUNTER — Other Ambulatory Visit: Payer: Self-pay | Admitting: Family Medicine

## 2023-03-07 DIAGNOSIS — N9489 Other specified conditions associated with female genital organs and menstrual cycle: Secondary | ICD-10-CM

## 2023-03-11 NOTE — Addendum Note (Signed)
Addended by: Abbe Amsterdam C on: 03/11/2023 03:23 PM   Modules accepted: Orders

## 2023-03-21 ENCOUNTER — Ambulatory Visit (HOSPITAL_BASED_OUTPATIENT_CLINIC_OR_DEPARTMENT_OTHER)
Admission: RE | Admit: 2023-03-21 | Discharge: 2023-03-21 | Disposition: A | Discharge status: Home / Self Care | Payer: BC Managed Care – PPO | Source: Ambulatory Visit | Attending: Family Medicine | Admitting: Family Medicine

## 2023-03-21 DIAGNOSIS — N9489 Other specified conditions associated with female genital organs and menstrual cycle: Secondary | ICD-10-CM | POA: Insufficient documentation

## 2023-03-28 ENCOUNTER — Encounter: Payer: Self-pay | Admitting: Family Medicine

## 2023-05-19 LAB — COLOGUARD

## 2023-05-20 ENCOUNTER — Encounter: Payer: Self-pay | Admitting: Family Medicine

## 2023-08-06 ENCOUNTER — Encounter: Payer: Self-pay | Admitting: Family Medicine

## 2023-09-26 ENCOUNTER — Encounter: Payer: Self-pay | Admitting: Family Medicine

## 2023-09-26 DIAGNOSIS — F411 Generalized anxiety disorder: Secondary | ICD-10-CM

## 2023-09-26 MED ORDER — FLUOXETINE HCL 20 MG PO CAPS: 60.0000 mg | ORAL_CAPSULE | Freq: Every day | ORAL | 0 refills | Status: DC

## 2023-11-15 NOTE — Patient Instructions (Signed)
 It was good to see you today, I will be in touch with your labs  Recommend seasonal flu shot this fall

## 2023-11-15 NOTE — Progress Notes (Unsigned)
 [facility] [address] [place], [place] [address] [phone]  Date:  11/18/2023   Name:  Anne [name]   DOB:  1965/07/31   MRN:  [id number]  PCP:  Watt Harlene BROCKS, MD    Chief Complaint: No chief complaint on file.   History of Present Illness:  Anne [name] is a 58 y.o. very pleasant female patient who presents with the following:  Patient seen today for physical exam.  I saw her last in November when she was dealing with sciatica History of GERD, headaches, depression She followed up with her gynecologist regarding a calcified finding in her uterus since her last visit She had plan to repeat a Cologuard kit but I do not see this result  Cologuard Shingrix, second dose Recommend flu shot this fall Can update labs Mammogram 10/24 Pap smear May need update DEXA scan Patient Active Problem List   Diagnosis Date Noted   Hoarseness    Gastroesophageal reflux disease 10/26/2016   Globus sensation 10/26/2016   Other chest pain 10/26/2016   Dyspnea on exertion 08/09/2016   Cervical post-laminectomy syndrome 04/25/2016   H/O Clostridium difficile infection 08/11/2015   Barrett's esophagus 08/18/2013   Scoliosis of lumbosacral spine 05/04/2013   Menorrhagia 04/04/2012   Eustachian tube dysfunction 01/10/2012   DEPRESSIVE DISORDER 06/01/2009   Headache 08/27/2008    Past Medical History:  Diagnosis Date   Anxiety    Arthritis    Barrett's esophagus    Cervical pseudoarthrosis (HCC)    Chronic back pain    Chronic bladder pain    Depression    Gastritis    GERD (gastroesophageal reflux disease)    History of Clostridium difficile infection    recurrent   History of condyloma acuminatum    vulvar 2000   History of kidney stones 2012   IC (interstitial cystitis)    Insomnia    Postlaminectomy syndrome of cervical region    Sensation of pressure in bladder area    Wears glasses      Past Surgical History:  Procedure Laterality Date   57 HOUR PH STUDY N/A 11/05/2016   Procedure: 24 HOUR PH STUDY;  Surgeon: Shila Gustav GAILS, MD;  Location: [facility];  Service: Endoscopy;  Laterality: N/A;   ANTERIOR CERVICAL DECOMP/DISCECTOMY FUSION  2015   C5 -- C7   BREAST ENHANCEMENT SURGERY Bilateral 2010   CYSTO WITH HYDRODISTENSION N/A 07/02/2016   Procedure: CYSTOSCOPY/HYDRODISTENSION;  Surgeon: Arlena Gal, MD;  Location: [facility];  Service: Urology;  Laterality: N/A;   DILATION AND CURETTAGE OF UTERUS     w/ suction for products of conception post partum   DILITATION & CURRETTAGE/HYSTROSCOPY WITH NOVASURE ABLATION  04/04/2012   Procedure: DILATATION & CURETTAGE/HYSTEROSCOPY WITH NOVASURE ABLATION;  Surgeon: Krystal Deaner, MD;  Location: [facility];  Service: Gynecology;  Laterality: N/A;   ESOPHAGEAL MANOMETRY N/A 11/05/2016   Procedure: ESOPHAGEAL MANOMETRY (EM);  Surgeon: Shila Gustav GAILS, MD;  Location: [facility];  Service: Endoscopy;  Laterality: N/A;   ESOPHAGOGASTRODUODENOSCOPY  last one 06-21-2016    Social History   Tobacco Use   Smoking status: Former    Current packs/day: 0.00    Average packs/day: 0.8 packs/day for 7.0 years (5.3 ttl pk-yrs)    Types: Cigarettes    Start date: 04/02/1980    Quit date: 04/03/1987    Years since quitting: 36.6   Smokeless tobacco: Never  Vaping  Use   Vaping status: Never Used  Substance Use Topics   Alcohol use: Yes    Alcohol/week: 0.0 standard drinks of alcohol    Comment: socially,maybe weekly   Drug use: No    Family History  Problem Relation Age of Onset   Ovarian cancer Mother    Testicular cancer Father    Hypertension Father    Cancer Maternal Grandmother        bone primary   Cancer Paternal Grandmother        breast with lung mets   Colon cancer Neg Hx    Stomach cancer Neg Hx    Esophageal cancer Neg Hx     Allergies  Allergen Reactions   Iodinated Contrast Media  Shortness Of Breath    Respiratory distress    Erythromycin Nausea And Vomiting        Moxifloxacin Nausea And Vomiting         Medication list has been reviewed and updated.  Current Outpatient Medications on File Prior to Visit  Medication Sig Dispense Refill   ALPRAZolam (XANAX) 0.5 MG tablet Take 0.5 mg by mouth as needed.     esomeprazole (NEXIUM) 40 MG capsule Take 40 mg by mouth 2 (two) times daily.     estradiol (VIVELLE-DOT) 0.075 MG/24HR APPLY 1 PATCH TOPICALLY TO THE SKIN 2 TIMES A WEEK     FLUoxetine (PROZAC) 20 MG capsule Take 3 capsules (60 mg total) by mouth daily. Needs appt 90 capsule 0   predniSONE (DELTASONE) 20 MG tablet Take 40 mg daily for 5 days, then 20 mg daily for 5 days 15 tablet 0   progesterone (PROMETRIUM) 100 MG capsule Take 1 tablet by mouth daily.     saccharomyces boulardii (FLORASTOR) 250 MG capsule Take 250 mg by mouth 2 (two) times daily.     zolpidem (AMBIEN) 10 MG tablet Take 0.5-1 tablets by mouth at bedtime.      No current facility-administered medications on file prior to visit.    Review of Systems:  As per HPI- otherwise negative.   Physical Examination: There were no vitals filed for this visit. There were no vitals filed for this visit. There is no height or weight on file to calculate BMI. Ideal Body Weight:    GEN: no acute distress. HEENT: Atraumatic, Normocephalic.  Ears and Nose: No external deformity. CV: RRR, No M/G/R. No JVD. No thrill. No extra heart sounds. PULM: CTA B, no wheezes, crackles, rhonchi. No retractions. No resp. distress. No accessory muscle use. ABD: S, NT, ND, +BS. No rebound. No HSM. EXTR: No c/c/e PSYCH: Normally interactive. Conversant. '  Assessment and Plan: *** Physical exam today.  Encouraged healthy diet and exercise routine Will plan further follow- up pending labs.  Signed Harlene Schroeder, MD

## 2023-11-18 ENCOUNTER — Encounter: Payer: Self-pay | Admitting: Family Medicine

## 2023-11-18 ENCOUNTER — Ambulatory Visit: Admitting: Family Medicine

## 2023-11-18 VITALS — BP 136/88 | HR 74 | Temp 98.0°F | Ht 67.0 in | Wt 164.4 lb

## 2023-11-18 DIAGNOSIS — Z131 Encounter for screening for diabetes mellitus: Secondary | ICD-10-CM

## 2023-11-18 DIAGNOSIS — Z13220 Encounter for screening for lipoid disorders: Secondary | ICD-10-CM

## 2023-11-18 DIAGNOSIS — Z23 Encounter for immunization: Secondary | ICD-10-CM | POA: Diagnosis not present

## 2023-11-18 DIAGNOSIS — Z130 Encounter for screening for diseases of the blood and blood-forming organs and certain disorders involving the immune mechanism: Secondary | ICD-10-CM

## 2023-11-18 DIAGNOSIS — K219 Gastro-esophageal reflux disease without esophagitis: Secondary | ICD-10-CM | POA: Diagnosis not present

## 2023-11-18 DIAGNOSIS — F411 Generalized anxiety disorder: Secondary | ICD-10-CM | POA: Diagnosis not present

## 2023-11-18 DIAGNOSIS — Z0000 Encounter for general adult medical examination without abnormal findings: Secondary | ICD-10-CM | POA: Diagnosis not present

## 2023-11-18 DIAGNOSIS — Z1211 Encounter for screening for malignant neoplasm of colon: Secondary | ICD-10-CM

## 2023-11-18 DIAGNOSIS — Z1329 Encounter for screening for other suspected endocrine disorder: Secondary | ICD-10-CM

## 2023-11-18 MED ORDER — ESOMEPRAZOLE MAGNESIUM 40 MG PO CPDR: 40.0000 mg | DELAYED_RELEASE_CAPSULE | Freq: Two times a day (BID) | ORAL | 1 refills | Status: AC | PRN

## 2023-11-18 MED ORDER — FLUOXETINE HCL 20 MG PO TABS: 30.0000 mg | ORAL_TABLET | Freq: Every day | ORAL | 3 refills | Status: AC

## 2023-11-18 NOTE — Progress Notes (Signed)
 Patient did not receive Shingrix  vaccine on this DOS.  Anne [name]

## 2023-11-19 ENCOUNTER — Encounter: Payer: Self-pay | Admitting: Family Medicine

## 2023-11-19 LAB — COMPREHENSIVE METABOLIC PANEL WITH GFR
ALT: 11 U/L (ref 0–35)
AST: 17 U/L (ref 0–37)
Albumin: 4.3 g/dL (ref 3.5–5.2)
Alkaline Phosphatase: 66 U/L (ref 39–117)
BUN: 15 mg/dL (ref 6–23)
CO2: 30 meq/L (ref 19–32)
Calcium: 9.1 mg/dL (ref 8.4–10.5)
Chloride: 100 meq/L (ref 96–112)
Creatinine, Ser: 0.78 mg/dL (ref 0.40–1.20)
GFR: 83.42 mL/min (ref >60.00)
Glucose, Bld: 83 mg/dL (ref 70–99)
Potassium: 4.1 meq/L (ref 3.5–5.1)
Sodium: 140 meq/L (ref 135–145)
Total Bilirubin: 0.3 mg/dL (ref 0.2–1.2)
Total Protein: 6.4 g/dL (ref 6.0–8.3)

## 2023-11-19 LAB — CBC
HCT: 38.9 % (ref 36.0–46.0)
Hemoglobin: 13.2 g/dL (ref 12.0–15.0)
MCHC: 34 g/dL (ref 30.0–36.0)
MCV: 102.8 fl — ABNORMAL HIGH (ref 78.0–100.0)
Platelets: 238 K/uL (ref 150.0–400.0)
RBC: 3.79 Mil/uL — ABNORMAL LOW (ref 3.87–5.11)
RDW: 13.4 % (ref 11.5–15.5)
WBC: 6.4 K/uL (ref 4.0–10.5)

## 2023-11-19 LAB — TSH: TSH: 1.43 u[IU]/mL (ref 0.35–5.50)

## 2023-11-19 LAB — LIPID PANEL
Cholesterol: 222 mg/dL — ABNORMAL HIGH (ref 0–200)
HDL: 106.7 mg/dL (ref >39.00)
LDL Cholesterol: 94 mg/dL (ref 0–99)
NonHDL: 115.06
Total CHOL/HDL Ratio: 2
Triglycerides: 104 mg/dL (ref 0.0–149.0)
VLDL: 20.8 mg/dL (ref 0.0–40.0)

## 2023-11-19 LAB — HEMOGLOBIN A1C: Hgb A1c MFr Bld: 5 % (ref 4.6–6.5)
